# Patient Record
Sex: Female | Born: 1960
Health system: Southern US, Community
[De-identification: ages and names within clinical notes are randomized; demographics above are authoritative.]

## PROBLEM LIST (undated history)

## (undated) DIAGNOSIS — R030 Elevated blood-pressure reading, without diagnosis of hypertension: Principal | ICD-10-CM

## (undated) DIAGNOSIS — E7800 Pure hypercholesterolemia, unspecified: Secondary | ICD-10-CM

## (undated) DIAGNOSIS — IMO0001 Reserved for inherently not codable concepts without codable children: Secondary | ICD-10-CM

## (undated) DIAGNOSIS — J309 Allergic rhinitis, unspecified: Secondary | ICD-10-CM

## (undated) HISTORY — DX: Pure hypercholesterolemia, unspecified: E78.00

## (undated) HISTORY — DX: Elevated blood-pressure reading, without diagnosis of hypertension: R03.0

## (undated) HISTORY — DX: Allergic rhinitis, unspecified: J30.9

## (undated) HISTORY — DX: Reserved for inherently not codable concepts without codable children: IMO0001

---

## 1990-04-25 HISTORY — PX: DILATION AND CURETTAGE, DIAGNOSTIC / THERAPEUTIC: SUR384

## 1990-04-25 HISTORY — PX: OTHER SURGICAL HISTORY: SHX169

## 1995-04-26 HISTORY — PX: HYSTEROSCOPY: SHX211

## 2004-04-13 ENCOUNTER — Ambulatory Visit: Payer: Self-pay | Admitting: Unknown Physician Specialty

## 2004-05-25 ENCOUNTER — Ambulatory Visit: Payer: Self-pay | Admitting: Unknown Physician Specialty

## 2005-06-07 ENCOUNTER — Ambulatory Visit: Payer: Self-pay | Admitting: Unknown Physician Specialty

## 2006-06-09 ENCOUNTER — Ambulatory Visit: Payer: Self-pay | Admitting: Unknown Physician Specialty

## 2007-06-13 ENCOUNTER — Ambulatory Visit: Payer: Self-pay | Admitting: Unknown Physician Specialty

## 2008-06-18 ENCOUNTER — Ambulatory Visit: Payer: Self-pay | Admitting: Unknown Physician Specialty

## 2008-06-24 ENCOUNTER — Ambulatory Visit: Payer: Self-pay | Admitting: Unknown Physician Specialty

## 2008-12-26 ENCOUNTER — Ambulatory Visit: Payer: Self-pay | Admitting: General Surgery

## 2009-07-21 ENCOUNTER — Ambulatory Visit: Payer: Self-pay | Admitting: Unknown Physician Specialty

## 2010-09-08 ENCOUNTER — Ambulatory Visit: Payer: Self-pay | Admitting: Unknown Physician Specialty

## 2011-12-08 ENCOUNTER — Ambulatory Visit: Payer: Self-pay | Admitting: Unknown Physician Specialty

## 2013-02-28 ENCOUNTER — Telehealth: Payer: Self-pay | Admitting: Emergency Medicine

## 2013-02-28 NOTE — Telephone Encounter (Signed)
Patient lvm stating she tried to schedule her own mammo but can't b/c she has been seen in our clinic yet. Pt wanting mammogram before being seen for new pt apt. Please advise.

## 2013-02-28 NOTE — Telephone Encounter (Signed)
I cannot order a mammogram on someone that I have not seen.  She can have her previous provider order or I can order when she is seen here.

## 2013-02-28 NOTE — Telephone Encounter (Signed)
Left detailed voicemail

## 2013-05-28 ENCOUNTER — Ambulatory Visit (INDEPENDENT_AMBULATORY_CARE_PROVIDER_SITE_OTHER): Payer: BC Managed Care – PPO | Admitting: Internal Medicine

## 2013-05-28 ENCOUNTER — Encounter: Payer: Self-pay | Admitting: Internal Medicine

## 2013-05-28 ENCOUNTER — Telehealth: Payer: Self-pay | Admitting: Internal Medicine

## 2013-05-28 VITALS — BP 138/90 | HR 65 | Temp 98.2°F | Ht 64.5 in | Wt 150.0 lb

## 2013-05-28 DIAGNOSIS — E7800 Pure hypercholesterolemia, unspecified: Secondary | ICD-10-CM

## 2013-05-28 DIAGNOSIS — Z1239 Encounter for other screening for malignant neoplasm of breast: Secondary | ICD-10-CM

## 2013-05-28 DIAGNOSIS — IMO0001 Reserved for inherently not codable concepts without codable children: Secondary | ICD-10-CM

## 2013-05-28 DIAGNOSIS — R110 Nausea: Secondary | ICD-10-CM

## 2013-05-28 DIAGNOSIS — R030 Elevated blood-pressure reading, without diagnosis of hypertension: Secondary | ICD-10-CM

## 2013-05-28 NOTE — Telephone Encounter (Signed)
Pt would like her mammogram on Tuesday Wednesday afternoon   Tammy Daniels

## 2013-05-28 NOTE — Progress Notes (Signed)
Pre-visit discussion using our clinic review tool. No additional management support is needed unless otherwise documented below in the visit note.  

## 2013-05-29 NOTE — Telephone Encounter (Signed)
Dr. Lorin PicketScott, can you place a mammo order.

## 2013-05-29 NOTE — Telephone Encounter (Signed)
Order placed for mammogram.

## 2013-05-30 ENCOUNTER — Telehealth: Payer: Self-pay | Admitting: *Deleted

## 2013-05-30 DIAGNOSIS — Z1211 Encounter for screening for malignant neoplasm of colon: Secondary | ICD-10-CM

## 2013-05-30 NOTE — Telephone Encounter (Signed)
Pt called back to let you know that she would like to go to Dr. Lutricia FeilPaul Oh for her Colonoscopy & [place] for her Mammogram (both are covered in her network)

## 2013-05-31 NOTE — Telephone Encounter (Signed)
Order already placed for mammogram.  Order placed for referral to GI for colonoscopy.

## 2013-06-02 ENCOUNTER — Encounter: Payer: Self-pay | Admitting: Internal Medicine

## 2013-06-02 DIAGNOSIS — I10 Essential (primary) hypertension: Secondary | ICD-10-CM | POA: Insufficient documentation

## 2013-06-02 DIAGNOSIS — E7800 Pure hypercholesterolemia, unspecified: Secondary | ICD-10-CM | POA: Insufficient documentation

## 2013-06-02 NOTE — Progress Notes (Signed)
   Subjective:    Patient ID: Tammy Daniels, female    DOB: 09/07/1960, 53 y.o.   MRN: [id number]  HPI 53 year old female with past history of hypercholesterolemia and elevated blood pressure who comes in today to follow up on these issues as well as to transfer her care here to [place].  Was a former patient of mine at [facility].  Has been exercising.  Walking on a treadmill.  States has some occasional nausea.  Nothing on a regular basis.  Rare.  No acid reflux.  Aggravated by eating greasy foods.  No cardiac symptoms with increased activity or exertion.  No bowel change.  Blood pressure on outside checks averaging 112-137/70-80s.     Past Medical History  Diagnosis Date  . Hypercholesterolemia   . Elevated blood pressure   . Allergic rhinitis     Outpatient Encounter Prescriptions as of 05/28/2013  Medication Sig  . CALCIUM PO Take by mouth.    Review of Systems Patient denies any headache, lightheadedness or dizziness.  No sinus or allergy symptoms.  No chest pain, tightness or palpitations.  No increased shortness of breath, cough or congestion.  No vomiting.  Some intermittent nausea.  No acid reflux.  No abdominal pain or cramping.  No bowel change, such as diarrhea, constipation, BRBPR or melana.  No urine change.   Blood pressure as outlined.       Objective:   Physical Exam Filed Vitals:   05/28/13 1547  BP: 138/90  Pulse: 65  Temp: 98.2 F (6736.718 C)   53 year old female in no acute distress.   HEENT:  Nares- clear.  Oropharynx - without lesions. NECK:  Supple.  Nontender.  No audible bruit.  HEART:  Appears to be regular. LUNGS:  No crackles or wheezing audible.  Respirations even and unlabored.  RADIAL PULSE:  Equal bilaterally.   ABDOMEN:  Soft, nontender.  Bowel sounds present and normal.  No audible abdominal bruit.   EXTREMITIES:  No increased edema present.  DP pulses palpable and equal bilaterally.          Assessment & Plan:  HEALTH MAINTENANCE.  Schedule her  for a physical next visit.  Schedule mammogram.  Will notify me if agreeable for colonoscopy.  Wants to check with her insurance.    I spent 25 minutes with the patient and more than 50% of the time was spent in consultation regarding the above.

## 2013-06-02 NOTE — Assessment & Plan Note (Signed)
Intermittent with greasy foods.  Desires no further w/up at this point.  Follow.

## 2013-06-02 NOTE — Assessment & Plan Note (Signed)
Low cholesterol diet and exercise.  Check lipid panel.   

## 2013-06-02 NOTE — Assessment & Plan Note (Signed)
Blood pressure as outlined.  Have her to continue to follow.  Check metabolic panel.

## 2013-06-12 ENCOUNTER — Other Ambulatory Visit: Payer: BC Managed Care – PPO

## 2013-06-19 ENCOUNTER — Other Ambulatory Visit: Payer: BC Managed Care – PPO

## 2013-06-21 ENCOUNTER — Encounter: Payer: Self-pay | Admitting: Internal Medicine

## 2013-06-21 DIAGNOSIS — E559 Vitamin D deficiency, unspecified: Secondary | ICD-10-CM

## 2013-06-26 ENCOUNTER — Other Ambulatory Visit (INDEPENDENT_AMBULATORY_CARE_PROVIDER_SITE_OTHER): Payer: BC Managed Care – PPO

## 2013-06-26 ENCOUNTER — Ambulatory Visit: Payer: Self-pay | Admitting: Internal Medicine

## 2013-06-26 DIAGNOSIS — IMO0001 Reserved for inherently not codable concepts without codable children: Secondary | ICD-10-CM

## 2013-06-26 DIAGNOSIS — E7800 Pure hypercholesterolemia, unspecified: Secondary | ICD-10-CM

## 2013-06-26 DIAGNOSIS — R110 Nausea: Secondary | ICD-10-CM

## 2013-06-26 DIAGNOSIS — R030 Elevated blood-pressure reading, without diagnosis of hypertension: Secondary | ICD-10-CM

## 2013-06-26 LAB — COMPREHENSIVE METABOLIC PANEL
ALT: 29 U/L (ref 0–35)
AST: 25 U/L (ref 0–37)
Albumin: 3.8 g/dL (ref 3.5–5.2)
Alkaline Phosphatase: 86 U/L (ref 39–117)
BUN: 14 mg/dL (ref 6–23)
CO2: 30 mEq/L (ref 19–32)
Calcium: 9.1 mg/dL (ref 8.4–10.5)
Chloride: 107 mEq/L (ref 96–112)
Creatinine, Ser: 0.7 mg/dL (ref 0.4–1.2)
GFR: 94.29 mL/min (ref >60.00)
Glucose, Bld: 93 mg/dL (ref 70–99)
Potassium: 4 mEq/L (ref 3.5–5.1)
Sodium: 142 mEq/L (ref 135–145)
Total Bilirubin: 0.8 mg/dL (ref 0.3–1.2)
Total Protein: 6.9 g/dL (ref 6.0–8.3)

## 2013-06-26 LAB — TSH: TSH: 2.91 u[IU]/mL (ref 0.35–5.50)

## 2013-06-26 LAB — CBC WITH DIFFERENTIAL/PLATELET
Basophils Absolute: 0 10*3/uL (ref 0.0–0.1)
Basophils Relative: 0.6 % (ref 0.0–3.0)
Eosinophils Absolute: 0.4 10*3/uL (ref 0.0–0.7)
Eosinophils Relative: 5.3 % — ABNORMAL HIGH (ref 0.0–5.0)
HCT: 44.3 % (ref 36.0–46.0)
Hemoglobin: 14.5 g/dL (ref 12.0–15.0)
Lymphocytes Relative: 30.6 % (ref 12.0–46.0)
Lymphs Abs: 2.3 10*3/uL (ref 0.7–4.0)
MCHC: 32.8 g/dL (ref 30.0–36.0)
MCV: 89.8 fl (ref 78.0–100.0)
Monocytes Absolute: 0.5 10*3/uL (ref 0.1–1.0)
Monocytes Relative: 6.2 % (ref 3.0–12.0)
Neutro Abs: 4.4 10*3/uL (ref 1.4–7.7)
Neutrophils Relative %: 57.3 % (ref 43.0–77.0)
Platelets: 245 10*3/uL (ref 150.0–400.0)
RBC: 4.93 Mil/uL (ref 3.87–5.11)
RDW: 12.8 % (ref 11.5–14.6)
WBC: 7.7 10*3/uL (ref 4.5–10.5)

## 2013-06-26 LAB — HM MAMMOGRAPHY

## 2013-06-26 LAB — LIPID PANEL
Cholesterol: 211 mg/dL — ABNORMAL HIGH (ref 0–200)
HDL: 47.8 mg/dL (ref >39.00)
LDL Cholesterol: 132 mg/dL — ABNORMAL HIGH (ref 0–99)
Total CHOL/HDL Ratio: 4
Triglycerides: 158 mg/dL — ABNORMAL HIGH (ref 0.0–149.0)
VLDL: 31.6 mg/dL (ref 0.0–40.0)

## 2013-06-27 ENCOUNTER — Encounter: Payer: Self-pay | Admitting: *Deleted

## 2013-06-27 ENCOUNTER — Encounter: Payer: Self-pay | Admitting: Internal Medicine

## 2013-07-09 LAB — HM COLONOSCOPY

## 2013-07-16 ENCOUNTER — Ambulatory Visit: Payer: Self-pay | Admitting: Internal Medicine

## 2013-07-16 ENCOUNTER — Encounter: Payer: Self-pay | Admitting: Internal Medicine

## 2013-07-16 LAB — HM MAMMOGRAPHY: HM Mammogram: NEGATIVE

## 2013-07-19 ENCOUNTER — Encounter: Payer: Self-pay | Admitting: Internal Medicine

## 2013-07-30 ENCOUNTER — Encounter: Payer: Self-pay | Admitting: Internal Medicine

## 2013-07-30 ENCOUNTER — Ambulatory Visit (INDEPENDENT_AMBULATORY_CARE_PROVIDER_SITE_OTHER): Payer: BC Managed Care – PPO | Admitting: Internal Medicine

## 2013-07-30 VITALS — BP 140/90 | HR 61 | Temp 98.2°F | Ht 64.5 in | Wt 149.8 lb

## 2013-07-30 DIAGNOSIS — E7800 Pure hypercholesterolemia, unspecified: Secondary | ICD-10-CM

## 2013-07-30 DIAGNOSIS — R030 Elevated blood-pressure reading, without diagnosis of hypertension: Secondary | ICD-10-CM

## 2013-07-30 DIAGNOSIS — IMO0001 Reserved for inherently not codable concepts without codable children: Secondary | ICD-10-CM

## 2013-07-30 DIAGNOSIS — M6530 Trigger finger, unspecified finger: Secondary | ICD-10-CM

## 2013-07-30 DIAGNOSIS — R110 Nausea: Secondary | ICD-10-CM

## 2013-07-30 DIAGNOSIS — E559 Vitamin D deficiency, unspecified: Secondary | ICD-10-CM

## 2013-07-30 NOTE — Assessment & Plan Note (Signed)
Blood pressure as outlined.  Have her to continue to follow.  Check metabolic panel.

## 2013-07-30 NOTE — Progress Notes (Signed)
   Subjective:    Patient ID: Tammy Daniels, female    DOB: 07/03/1960, 53 y.o.   MRN: [id number]  HPI [age] year old female with past history of hypercholesterolemia and elevated blood pressure who comes in today to follow up on these issues as well as for a complete physical exam.   Has been exercising.  Walking on a treadmill.  Feels good.  No significant nausea.  No vomiting.   No acid reflux.  No cardiac symptoms with increased activity or exertion.  No bowel change.  Blood pressure on outside checks averaging 120-130s/70-80s. She does have a trigger finger - right third finger.  No pain.  Discussed conservative treatment.      Past Medical History  Diagnosis Date  . Hypercholesterolemia   . Elevated blood pressure   . Allergic rhinitis     Outpatient Encounter Prescriptions as of 07/30/2013  Medication Sig  . CALCIUM PO Take by mouth.    Review of Systems Patient denies any headache, lightheadedness or dizziness.  No sinus or allergy symptoms.  No chest pain, tightness or palpitations.  No increased shortness of breath, cough or congestion.  No vomiting.  No significant nausea reported.  No acid reflux.  No abdominal pain or cramping.  No bowel change, such as diarrhea, constipation, BRBPR or melana.  No urine change.   Blood pressure as outlined.  Right trigger finger as outlined.       Objective:   Physical Exam  Filed Vitals:   07/30/13 1136  BP: 140/90  Pulse: 61  Temp: 98.2 F (736.618 C)   [age] year old female in no acute distress.   HEENT:  Nares- clear.  Oropharynx - without lesions. NECK:  Supple.  Nontender.  No audible bruit.  HEART:  Appears to be regular. LUNGS:  No crackles or wheezing audible.  Respirations even and unlabored.  RADIAL PULSE:  Equal bilaterally.    BREASTS:  No nipple discharge or nipple retraction present.  Could not appreciate any distinct nodules or axillary adenopathy.  ABDOMEN:  Soft, nontender.  Bowel sounds present and normal.  No audible  abdominal bruit.  GU:  Not performed.     EXTREMITIES:  No increased edema present.  DP pulses palpable and equal bilaterally.          Assessment & Plan:  HEALTH MAINTENANCE.  Physical today.  10/24/11 - negative pap.  She declined pap today.   States had diagnostic mammogram that was ok.  Per quick abstraction, mammogram 07/16/13 [facility] I.  Colonoscopy 3/15 ok.    I spent 25 minutes with the patient and more than 50% of the time was spent in consultation regarding the above.

## 2013-07-30 NOTE — Progress Notes (Signed)
Pre-visit discussion using our clinic review tool. No additional management support is needed unless otherwise documented below in the visit note.  

## 2013-07-30 NOTE — Assessment & Plan Note (Signed)
Low cholesterol diet and exercise.  Check lipid panel.   

## 2013-08-04 ENCOUNTER — Encounter: Payer: Self-pay | Admitting: Internal Medicine

## 2013-08-04 DIAGNOSIS — M6530 Trigger finger, unspecified finger: Secondary | ICD-10-CM | POA: Insufficient documentation

## 2013-08-04 NOTE — Assessment & Plan Note (Signed)
Resolved

## 2013-08-04 NOTE — Assessment & Plan Note (Signed)
Follow vitamin D level.  

## 2013-08-04 NOTE — Assessment & Plan Note (Signed)
Involves right third finger.  Splint.  Follow.  Desires no further w/up at this time.

## 2013-11-12 ENCOUNTER — Encounter: Payer: Self-pay | Admitting: Internal Medicine

## 2013-11-12 ENCOUNTER — Ambulatory Visit (INDEPENDENT_AMBULATORY_CARE_PROVIDER_SITE_OTHER): Payer: BC Managed Care – PPO | Admitting: Internal Medicine

## 2013-11-12 ENCOUNTER — Other Ambulatory Visit (HOSPITAL_COMMUNITY)
Admission: RE | Admit: 2013-11-12 | Discharge: 2013-11-12 | Disposition: A | Discharge status: Home / Self Care | Payer: BC Managed Care – PPO | Source: Ambulatory Visit | Attending: Internal Medicine | Admitting: Internal Medicine

## 2013-11-12 VITALS — BP 126/92 | HR 72 | Temp 98.3°F | Ht 64.5 in | Wt 151.5 lb

## 2013-11-12 DIAGNOSIS — IMO0001 Reserved for inherently not codable concepts without codable children: Secondary | ICD-10-CM

## 2013-11-12 DIAGNOSIS — Z1151 Encounter for screening for human papillomavirus (HPV): Secondary | ICD-10-CM | POA: Insufficient documentation

## 2013-11-12 DIAGNOSIS — R030 Elevated blood-pressure reading, without diagnosis of hypertension: Secondary | ICD-10-CM

## 2013-11-12 DIAGNOSIS — Z124 Encounter for screening for malignant neoplasm of cervix: Secondary | ICD-10-CM

## 2013-11-12 DIAGNOSIS — E7800 Pure hypercholesterolemia, unspecified: Secondary | ICD-10-CM

## 2013-11-12 DIAGNOSIS — Z01419 Encounter for gynecological examination (general) (routine) without abnormal findings: Secondary | ICD-10-CM | POA: Insufficient documentation

## 2013-11-12 MED ORDER — SERTRALINE HCL 50 MG PO TABS: 50.0000 mg | ORAL_TABLET | Freq: Every day | ORAL | Status: DC

## 2013-11-12 NOTE — Patient Instructions (Signed)
Take 1/2 tablet per day for one week and then increase to one whole tablet per day. 

## 2013-11-12 NOTE — Progress Notes (Signed)
Pre visit review using our clinic review tool, if applicable. No additional management support is needed unless otherwise documented below in the visit note. 

## 2013-11-14 LAB — CYTOLOGY - PAP

## 2013-11-15 ENCOUNTER — Encounter: Payer: Self-pay | Admitting: *Deleted

## 2013-11-17 ENCOUNTER — Encounter: Payer: Self-pay | Admitting: Internal Medicine

## 2013-11-17 NOTE — Progress Notes (Signed)
   Subjective:    Patient ID: Tammy Daniels, female    DOB: 02/23/1961, 53 y.o.   MRN: [id number]  Gynecologic Exam  53 year old female with past history of hypercholesterolemia and elevated blood pressure who comes in today to follow up on these issues as well as for a pap smear.   Has been exercising.  Walking on a treadmill.  Feels good.  No significant nausea.  No vomiting.   No acid reflux.  No cardiac symptoms with increased activity or exertion.  No bowel change.  Blood pressure on outside checks averaging 120-130s/70-80s.  See her attached list for details.     Past Medical History  Diagnosis Date  . Hypercholesterolemia   . Elevated blood pressure   . Allergic rhinitis     Outpatient Encounter Prescriptions as of 11/12/2013  Medication Sig  . CALCIUM PO Take by mouth.  . sertraline (ZOLOFT) 50 MG tablet Take 1 tablet (50 mg total) by mouth daily.    Review of Systems Patient denies any headache, lightheadedness or dizziness.  No sinus or allergy symptoms.  No chest pain, tightness or palpitations.  No increased shortness of breath, cough or congestion.  No vomiting.  No nausea reported.  No acid reflux.  No abdominal pain or cramping.  No bowel change, such as diarrhea, constipation, BRBPR or melana.  No urine change.   Blood pressure as outlined.      Objective:   Physical Exam  Filed Vitals:   11/12/13 0949  BP: 126/92  Pulse: 72  Temp: 98.3 F (36.8 C)   Blood pressure recheck:  89128/584  53 year old female in no acute distress.   HEENT:  Nares- clear.  Oropharynx - without lesions. NECK:  Supple.  Nontender.  No audible bruit.  HEART:  Appears to be regular. LUNGS:  No crackles or wheezing audible.  Respirations even and unlabored.  RADIAL PULSE:  Equal bilaterally.  ABDOMEN:  Soft, nontender.  Bowel sounds present and normal.  No audible abdominal bruit.  GU:  Normal external genitalia.  Vaginal vault without lesions.  Cervix identified.  Pap performed. Could not  appreciate any adnexal masses or tenderness.   EXTREMITIES:  No increased edema present.  DP pulses palpable and equal bilaterally.          Assessment & Plan:  HEALTH MAINTENANCE.  Physical 07/30/13.   10/24/11 - negative pap.  She declined pap at physical so pap performed today.  States had diagnostic mammogram that was ok.  Per quick abstraction, mammogram 07/16/13 [facility] I.  Colonoscopy 3/15 ok.

## 2013-11-17 NOTE — Assessment & Plan Note (Signed)
Blood pressure as outlined.  Have her to continue to follow.  Follow metabolic panel.

## 2013-11-17 NOTE — Assessment & Plan Note (Signed)
Low cholesterol diet and exercise.  Follow lipid panel.   

## 2013-12-17 ENCOUNTER — Other Ambulatory Visit: Payer: BC Managed Care – PPO

## 2013-12-24 ENCOUNTER — Other Ambulatory Visit (INDEPENDENT_AMBULATORY_CARE_PROVIDER_SITE_OTHER): Payer: BC Managed Care – PPO

## 2013-12-24 DIAGNOSIS — IMO0001 Reserved for inherently not codable concepts without codable children: Secondary | ICD-10-CM

## 2013-12-24 DIAGNOSIS — R030 Elevated blood-pressure reading, without diagnosis of hypertension: Secondary | ICD-10-CM

## 2013-12-24 DIAGNOSIS — E7800 Pure hypercholesterolemia, unspecified: Secondary | ICD-10-CM

## 2013-12-24 LAB — LIPID PANEL
Cholesterol: 217 mg/dL — ABNORMAL HIGH (ref 0–200)
HDL: 50.4 mg/dL (ref >39.00)
LDL Cholesterol: 139 mg/dL — ABNORMAL HIGH (ref 0–99)
NonHDL: 166.6
Total CHOL/HDL Ratio: 4
Triglycerides: 137 mg/dL (ref 0.0–149.0)
VLDL: 27.4 mg/dL (ref 0.0–40.0)

## 2013-12-24 LAB — COMPREHENSIVE METABOLIC PANEL
ALT: 17 U/L (ref 0–35)
AST: 18 U/L (ref 0–37)
Albumin: 3.9 g/dL (ref 3.5–5.2)
Alkaline Phosphatase: 87 U/L (ref 39–117)
BUN: 16 mg/dL (ref 6–23)
CO2: 28 mEq/L (ref 19–32)
Calcium: 8.9 mg/dL (ref 8.4–10.5)
Chloride: 106 mEq/L (ref 96–112)
Creatinine, Ser: 0.7 mg/dL (ref 0.4–1.2)
GFR: 92.57 mL/min (ref >60.00)
Glucose, Bld: 98 mg/dL (ref 70–99)
Potassium: 3.8 mEq/L (ref 3.5–5.1)
Sodium: 141 mEq/L (ref 135–145)
Total Bilirubin: 0.8 mg/dL (ref 0.2–1.2)
Total Protein: 6.9 g/dL (ref 6.0–8.3)

## 2013-12-25 ENCOUNTER — Encounter: Payer: Self-pay | Admitting: *Deleted

## 2014-01-07 ENCOUNTER — Ambulatory Visit (INDEPENDENT_AMBULATORY_CARE_PROVIDER_SITE_OTHER): Payer: BC Managed Care – PPO | Admitting: Internal Medicine

## 2014-01-07 ENCOUNTER — Encounter: Payer: Self-pay | Admitting: Internal Medicine

## 2014-01-07 VITALS — BP 120/82 | HR 62 | Temp 97.8°F | Resp 14 | Ht 64.5 in | Wt 148.5 lb

## 2014-01-07 DIAGNOSIS — R030 Elevated blood-pressure reading, without diagnosis of hypertension: Secondary | ICD-10-CM

## 2014-01-07 DIAGNOSIS — Z733 Stress, not elsewhere classified: Secondary | ICD-10-CM

## 2014-01-07 DIAGNOSIS — F439 Reaction to severe stress, unspecified: Secondary | ICD-10-CM

## 2014-01-07 DIAGNOSIS — E7800 Pure hypercholesterolemia, unspecified: Secondary | ICD-10-CM

## 2014-01-07 DIAGNOSIS — E559 Vitamin D deficiency, unspecified: Secondary | ICD-10-CM

## 2014-01-07 DIAGNOSIS — IMO0001 Reserved for inherently not codable concepts without codable children: Secondary | ICD-10-CM

## 2014-01-07 NOTE — Progress Notes (Signed)
Pre visit review using our clinic review tool, if applicable. No additional management support is needed unless otherwise documented below in the visit note. 

## 2014-01-12 ENCOUNTER — Encounter: Payer: Self-pay | Admitting: Internal Medicine

## 2014-01-12 DIAGNOSIS — F439 Reaction to severe stress, unspecified: Secondary | ICD-10-CM | POA: Insufficient documentation

## 2014-01-12 NOTE — Assessment & Plan Note (Signed)
Follow vitamin D level.  

## 2014-01-12 NOTE — Assessment & Plan Note (Signed)
Increased stress.  On zoloft.  Doing better.

## 2014-01-12 NOTE — Assessment & Plan Note (Addendum)
Low cholesterol diet and exercise.  Follow lipid panel.  Cholesterol panel just checked revealed total cholesterol 217, triglycerides 137, HDL 50 and LDL 139.

## 2014-01-12 NOTE — Progress Notes (Signed)
   Subjective:    Patient ID: Tammy Daniels, female    DOB: December 25, 1960, 53 y.o.   MRN: [id number]  HPI 54 year old female with past history of hypercholesterolemia and elevated blood pressure who comes in today for a scheduled follow up.  Here to f/u on her blood pressures.   Has been exercising.  No vomiting.   No acid reflux.  No cardiac symptoms with increased activity or exertion.  No bowel change.  Blood pressure on outside checks averaging 120-130s/70-80s.  On zoloft.  This is working well for her.       Past Medical History  Diagnosis Date  . Hypercholesterolemia   . Elevated blood pressure   . Allergic rhinitis     Outpatient Encounter Prescriptions as of 01/07/2014  Medication Sig  . CALCIUM PO Take by mouth.  . sertraline (ZOLOFT) 50 MG tablet Take 1 tablet (50 mg total) by mouth daily.    Review of Systems Patient denies any headache, lightheadedness or dizziness.  No sinus or allergy symptoms.  No chest pain, tightness or palpitations.  No increased shortness of breath, cough or congestion.  No vomiting.  No acid reflux.  No abdominal pain or cramping.  No bowel change, such as diarrhea, constipation, BRBPR or melana.  No urine change.   Blood pressure as outlined.       Objective:   Physical Exam  Filed Vitals:   01/07/14 1159  BP: 120/82  Pulse: 62  Temp: 97.8 F (36.6 C)  Resp: 14   Blood pressure recheck:  31/54  53 year old female in no acute distress.   HEENT:  Nares- clear.  Oropharynx - without lesions. NECK:  Supple.  Nontender.  No audible bruit.  HEART:  Appears to be regular. LUNGS:  No crackles or wheezing audible.  Respirations even and unlabored.  RADIAL PULSE:  Equal bilaterally.  ABDOMEN:  Soft, nontender.  Bowel sounds present and normal.  No audible abdominal bruit.     EXTREMITIES:  No increased edema present.  DP pulses palpable and equal bilaterally.          Assessment & Plan:  HEALTH MAINTENANCE.  Physical 07/30/13.  10/24/11 - negative  pap.  She declined pap at her last physical.   States had diagnostic mammogram that was ok.  Per quick abstraction, mammogram 07/16/13 [facility] I.  Colonoscopy 3/15 ok.

## 2014-01-12 NOTE — Assessment & Plan Note (Signed)
Blood pressure appears to be doing well.  Follow.    

## 2014-01-28 ENCOUNTER — Telehealth: Payer: Self-pay | Admitting: *Deleted

## 2014-01-28 ENCOUNTER — Ambulatory Visit (INDEPENDENT_AMBULATORY_CARE_PROVIDER_SITE_OTHER): Payer: BC Managed Care – PPO | Admitting: Internal Medicine

## 2014-01-28 ENCOUNTER — Encounter: Payer: Self-pay | Admitting: Internal Medicine

## 2014-01-28 VITALS — BP 124/90 | HR 63 | Temp 97.9°F | Ht 64.5 in | Wt 145.0 lb

## 2014-01-28 DIAGNOSIS — IMO0001 Reserved for inherently not codable concepts without codable children: Secondary | ICD-10-CM

## 2014-01-28 DIAGNOSIS — R030 Elevated blood-pressure reading, without diagnosis of hypertension: Secondary | ICD-10-CM

## 2014-01-28 DIAGNOSIS — R197 Diarrhea, unspecified: Secondary | ICD-10-CM

## 2014-01-28 NOTE — Telephone Encounter (Signed)
Since persistent diarrhea, needs evaluation.  See if pt can come in at 11:00 today.

## 2014-01-28 NOTE — Telephone Encounter (Signed)
Pt reports diarrhea daily since starting to take Zoloft daily. Pt stated that she hasn't taken any since Saturday, still experiencing diarrhea. No stomach pain, fever, vomiting, bloating, or nausea. Please advise. (Okay to leave voicemail on cell)

## 2014-01-28 NOTE — Telephone Encounter (Signed)
Pt notified, verbalized understanding.

## 2014-01-28 NOTE — Patient Instructions (Signed)
Align - one per day 

## 2014-01-28 NOTE — Progress Notes (Signed)
Pre visit review using our clinic review tool, if applicable. No additional management support is needed unless otherwise documented below in the visit note. 

## 2014-01-30 LAB — FECAL LACTOFERRIN, QUANT: Lactoferrin: NEGATIVE

## 2014-01-30 LAB — C. DIFFICILE GDH AND TOXIN A/B
C. difficile GDH: NOT DETECTED
C. difficile Toxin A/B: NOT DETECTED

## 2014-01-30 LAB — OVA AND PARASITE EXAMINATION: OP: NONE SEEN

## 2014-02-02 ENCOUNTER — Encounter: Payer: Self-pay | Admitting: Internal Medicine

## 2014-02-02 DIAGNOSIS — R195 Other fecal abnormalities: Secondary | ICD-10-CM | POA: Insufficient documentation

## 2014-02-02 LAB — STOOL CULTURE

## 2014-02-02 NOTE — Progress Notes (Signed)
   Subjective:    Patient ID: Tammy Daniels, female    DOB: 02/05/1961, 53 y.o.   MRN: [id number]  Diarrhea   53 year old female with past history of hypercholesterolemia and elevated blood pressure who comes in today as a work in with concerns regarding persistent diarrhea.  Has been present for two weeks.  No nausea.  No vomiting.   No acid reflux.  Appetite good.  States she is having watery stool.  No abdominal pain.  1-2 stools per day.  No blood.  She was questioning if zoloft contributing.  Stopped her zoloft several days ago.         Past Medical History  Diagnosis Date  . Hypercholesterolemia   . Elevated blood pressure   . Allergic rhinitis     Outpatient Encounter Prescriptions as of 01/28/2014  Medication Sig  . CALCIUM PO Take by mouth.  . sertraline (ZOLOFT) 50 MG tablet Take 1 tablet (50 mg total) by mouth daily.    Review of Systems  Gastrointestinal: Positive for diarrhea.  Patient denies any headache, lightheadedness or dizziness.  No fever.   No vomiting.  No acid reflux.  No abdominal pain or cramping.  Diarrhea as outlined.  No blood.  Watery stool.  Present for two weeks.       Objective:   Physical Exam  Filed Vitals:   01/28/14 1058  BP: 124/90  Pulse: 63  Temp: 97.9 F (336.666 C)   53 year old female in no acute distress.   HEENT:  Nares- clear.  Oropharynx - without lesions. NECK:  Supple.  Nontender.  HEART:  Appears to be regular. LUNGS:  No crackles or wheezing audible.  Respirations even and unlabored.  ABDOMEN:  Soft, nontender.  Bowel sounds present and normal.  No audible abdominal bruit.          Assessment & Plan:  HEALTH MAINTENANCE.  Physical 07/30/13.  10/24/11 - negative pap.  She declined pap at her last physical.   States had diagnostic mammogram that was ok.  Per quick abstraction, mammogram 07/16/13 [facility] I.  Colonoscopy 3/15 ok.

## 2014-02-02 NOTE — Assessment & Plan Note (Signed)
Blood pressure elevated today.  Have her spot check her pressure.  Follow.

## 2014-02-02 NOTE — Assessment & Plan Note (Signed)
Persistent diarrhea.  No pain.  Questioning if medication contributing.  Is off zoloft.  Will remain off.  Check stool studies, including c. Diff, wbc's, routine culture and O&P.  Start probiotic daily.  Follow.

## 2014-02-12 ENCOUNTER — Ambulatory Visit (INDEPENDENT_AMBULATORY_CARE_PROVIDER_SITE_OTHER): Payer: BC Managed Care – PPO | Admitting: Internal Medicine

## 2014-02-12 ENCOUNTER — Encounter: Payer: Self-pay | Admitting: Internal Medicine

## 2014-02-12 VITALS — BP 110/80 | HR 66 | Temp 98.2°F | Ht 64.5 in | Wt 145.5 lb

## 2014-02-12 DIAGNOSIS — E7800 Pure hypercholesterolemia, unspecified: Secondary | ICD-10-CM

## 2014-02-12 DIAGNOSIS — F439 Reaction to severe stress, unspecified: Secondary | ICD-10-CM

## 2014-02-12 DIAGNOSIS — R197 Diarrhea, unspecified: Secondary | ICD-10-CM

## 2014-02-12 DIAGNOSIS — R030 Elevated blood-pressure reading, without diagnosis of hypertension: Secondary | ICD-10-CM

## 2014-02-12 DIAGNOSIS — Z658 Other specified problems related to psychosocial circumstances: Secondary | ICD-10-CM

## 2014-02-12 DIAGNOSIS — IMO0001 Reserved for inherently not codable concepts without codable children: Secondary | ICD-10-CM

## 2014-02-12 MED ORDER — CITALOPRAM HYDROBROMIDE 10 MG PO TABS: 10.0000 mg | ORAL_TABLET | Freq: Every day | ORAL | Status: DC

## 2014-02-12 NOTE — Progress Notes (Signed)
Pre visit review using our clinic review tool, if applicable. No additional management support is needed unless otherwise documented below in the visit note. 

## 2014-02-16 ENCOUNTER — Encounter: Payer: Self-pay | Admitting: Internal Medicine

## 2014-02-16 NOTE — Assessment & Plan Note (Signed)
Resolved

## 2014-02-16 NOTE — Progress Notes (Signed)
   Subjective:    Patient ID: Tammy Daniels, female    DOB: 03/22/1961, 53 y.o.   MRN: [id number]  HPI 53 year old female with past history of hypercholesterolemia and elevated blood pressure who comes in today for a scheduled follow up.  Was having issues with diarrhea.  She was concerned about the zoloft contributing.  Stopped zoloft.  Diarrhea has resolved.  Stool studies negative.   No vomiting.   No acid reflux.  No cardiac symptoms with increased activity or exertion.  No bowel change.        Past Medical History  Diagnosis Date  . Hypercholesterolemia   . Elevated blood pressure   . Allergic rhinitis     Outpatient Encounter Prescriptions as of 02/12/2014  Medication Sig  . CALCIUM PO Take by mouth.  . Probiotic Product (PROBIOTIC DAILY PO) Take by mouth.  . citalopram (CELEXA) 10 MG tablet Take 1 tablet (10 mg total) by mouth daily.  . [DISCONTINUED] sertraline (ZOLOFT) 50 MG tablet Take 1 tablet (50 mg total) by mouth daily.    Review of Systems Patient denies any headache, lightheadedness or dizziness.  No sinus or allergy symptoms.  No chest pain, tightness or palpitations.  No increased shortness of breath, cough or congestion.  No vomiting.  No acid reflux.  No abdominal pain or cramping.  No bowel change, such as diarrhea, constipation, BRBPR or melana.  No urine change.   Blood pressure as outlined.  Off zolft.  Feels more irritated.  Sleeping ok.       Objective:   Physical Exam  Filed Vitals:   02/12/14 1146  BP: 110/80  Pulse: 66  Temp: 98.2 F (1036.528 C)   53 year old female in no acute distress.   HEENT:  Nares- clear.  Oropharynx - without lesions. NECK:  Supple.  Nontender.  No audible bruit.  HEART:  Appears to be regular. LUNGS:  No crackles or wheezing audible.  Respirations even and unlabored.  RADIAL PULSE:  Equal bilaterally.  ABDOMEN:  Soft, nontender.  Bowel sounds present and normal.  No audible abdominal bruit.     EXTREMITIES:  No increased  edema present.  DP pulses palpable and equal bilaterally.          Assessment & Plan:  HEALTH MAINTENANCE.  Physical 07/30/13.  10/24/11 - negative pap.  She declined pap at her last physical.   States had diagnostic mammogram that was ok.  Per quick abstraction, mammogram 07/16/13 [facility] I.  Colonoscopy 3/15 ok.    Problem List Items Addressed This Visit   Diarrhea     Resolved.       Elevated blood pressure - Primary     Blood pressure doing well.  Follow.       Hypercholesterolemia     Low cholesterol diet and exercise.  Follow lipid panel.       Stress     Off zoloft.  Feels she needs to be on medication.  Start citalopram 10mg  q day.  Follow.

## 2014-02-16 NOTE — Assessment & Plan Note (Signed)
Blood pressure doing well.  Follow.  

## 2014-02-16 NOTE — Assessment & Plan Note (Signed)
Off zoloft.  Feels she needs to be on medication.  Start citalopram 10mg  q day.  Follow.

## 2014-02-16 NOTE — Assessment & Plan Note (Signed)
Low cholesterol diet and exercise.  Follow lipid panel.   

## 2014-04-09 ENCOUNTER — Encounter: Payer: Self-pay | Admitting: Internal Medicine

## 2014-04-09 ENCOUNTER — Ambulatory Visit (INDEPENDENT_AMBULATORY_CARE_PROVIDER_SITE_OTHER): Payer: BC Managed Care – PPO | Admitting: Internal Medicine

## 2014-04-09 VITALS — BP 110/80 | HR 69 | Temp 98.3°F | Ht 64.5 in | Wt 146.0 lb

## 2014-04-09 DIAGNOSIS — F439 Reaction to severe stress, unspecified: Secondary | ICD-10-CM

## 2014-04-09 DIAGNOSIS — Z658 Other specified problems related to psychosocial circumstances: Secondary | ICD-10-CM

## 2014-04-09 DIAGNOSIS — IMO0001 Reserved for inherently not codable concepts without codable children: Secondary | ICD-10-CM

## 2014-04-09 DIAGNOSIS — E7800 Pure hypercholesterolemia, unspecified: Secondary | ICD-10-CM

## 2014-04-09 DIAGNOSIS — R030 Elevated blood-pressure reading, without diagnosis of hypertension: Secondary | ICD-10-CM

## 2014-04-09 MED ORDER — CITALOPRAM HYDROBROMIDE 10 MG PO TABS: 10.0000 mg | ORAL_TABLET | Freq: Every day | ORAL | Status: DC

## 2014-04-09 NOTE — Progress Notes (Signed)
   Subjective:    Patient ID: Tammy Daniels, female    DOB: 01/23/1961, 53 y.o.   MRN: [id number]  HPI 53 year old female with past history of hypercholesterolemia and elevated blood pressure who comes in today for a scheduled follow up.  Was having issues with diarrhea.  She was concerned about the zoloft contributing.  Stopped zoloft.  Diarrhea has resolved.  Stool studies negative.   No vomiting.   No acid reflux. No further diarrhea.  We started her on citalopram.  Doing well with this.  Feels better.  Feels is helping.  No cardiac symptoms with increased activity or exertion.  No bowel change.        Past Medical History  Diagnosis Date  . Hypercholesterolemia   . Elevated blood pressure   . Allergic rhinitis     Outpatient Encounter Prescriptions as of 04/09/2014  Medication Sig  . CALCIUM PO Take by mouth.  . citalopram (CELEXA) 10 MG tablet Take 1 tablet (10 mg total) by mouth daily.  . [DISCONTINUED] Probiotic Product (PROBIOTIC DAILY PO) Take by mouth.    Review of Systems Patient denies any headache, lightheadedness or dizziness.  No sinus or allergy symptoms.  No chest pain, tightness or palpitations.  No increased shortness of breath, cough or congestion.  No vomiting.  No acid reflux.  No abdominal pain or cramping.  No bowel change, such as diarrhea, constipation, BRBPR or melana.  No urine change.   Blood pressure doing well.  Outside checks averagint 115-130/70-80s.  On citalopram.  Doing well with this.       Objective:   Physical Exam  Filed Vitals:   04/09/14 1011  BP: 110/80  Pulse: 69  Temp: 98.3 F (36.8 C)  .  53 year old female in no acute distress.   HEENT:  Nares- clear.  Oropharynx - without lesions. NECK:  Supple.  Nontender.  No audible bruit.  HEART:  Appears to be regular. LUNGS:  No crackles or wheezing audible.  Respirations even and unlabored.  RADIAL PULSE:  Equal bilaterally.  ABDOMEN:  Soft, nontender.  Bowel sounds present and normal.  No  audible abdominal bruit.     EXTREMITIES:  No increased edema present.  DP pulses palpable and equal bilaterally.          Assessment & Plan:  1. Elevated blood pressure Blood pressure doing better now.  Follow.   2. Hypercholesterolemia Low cholesterol diet and exercise.  Follow.    3. Stress Doing better on citalopram.  Same dose.  Follow.    HEALTH MAINTENANCE.  Physical 07/30/13.  10/24/11 - negative pap.  She declined pap at her last physical.   States had diagnostic mammogram that was ok.  Per quick abstraction, mammogram 07/16/13 [facility] I.  Colonoscopy 3/15 ok.

## 2014-04-09 NOTE — Progress Notes (Signed)
Pre visit review using our clinic review tool, if applicable. No additional management support is needed unless otherwise documented below in the visit note. 

## 2014-04-13 ENCOUNTER — Encounter: Payer: Self-pay | Admitting: Internal Medicine

## 2014-08-19 ENCOUNTER — Encounter: Payer: Self-pay | Admitting: Internal Medicine

## 2014-08-19 ENCOUNTER — Ambulatory Visit (INDEPENDENT_AMBULATORY_CARE_PROVIDER_SITE_OTHER): Payer: Managed Care, Other (non HMO) | Admitting: Internal Medicine

## 2014-08-19 VITALS — BP 129/83 | HR 67 | Temp 97.8°F | Ht 64.5 in | Wt 148.0 lb

## 2014-08-19 DIAGNOSIS — R197 Diarrhea, unspecified: Secondary | ICD-10-CM

## 2014-08-19 DIAGNOSIS — Z658 Other specified problems related to psychosocial circumstances: Secondary | ICD-10-CM

## 2014-08-19 DIAGNOSIS — E7800 Pure hypercholesterolemia, unspecified: Secondary | ICD-10-CM

## 2014-08-19 DIAGNOSIS — Z0000 Encounter for general adult medical examination without abnormal findings: Secondary | ICD-10-CM

## 2014-08-19 DIAGNOSIS — R030 Elevated blood-pressure reading, without diagnosis of hypertension: Secondary | ICD-10-CM | POA: Diagnosis not present

## 2014-08-19 DIAGNOSIS — F439 Reaction to severe stress, unspecified: Secondary | ICD-10-CM

## 2014-08-19 DIAGNOSIS — Z1239 Encounter for other screening for malignant neoplasm of breast: Secondary | ICD-10-CM | POA: Diagnosis not present

## 2014-08-19 DIAGNOSIS — IMO0001 Reserved for inherently not codable concepts without codable children: Secondary | ICD-10-CM

## 2014-08-19 NOTE — Progress Notes (Signed)
Patient ID: Tammy Daniels, female   DOB: 05/22/1960, 54 y.o.   MRN: [id number]   Subjective:    Patient ID: Tammy Daniels, female    DOB: 03/19/1961, 54 y.o.   MRN: [id number]  HPI  Patient here for a scheduled follow up.  Here to f/u on her blood pressure.  Brings in outside readings.  Blood pressures averaging 120-140/60-80s.  Stays active.  No cardiac symptoms with increased activity or exertion.  Breathing stable.  Bowels stable.  No diarrhea.  On citalopram.  Doing well on this.  Feels handling stress well.     Past Medical History  Diagnosis Date  . Hypercholesterolemia   . Elevated blood pressure   . Allergic rhinitis     Outpatient Encounter Prescriptions as of 08/19/2014  Medication Sig  . CALCIUM PO Take by mouth.  . citalopram (CELEXA) 10 MG tablet Take 1 tablet (10 mg total) by mouth daily.    Review of Systems  Constitutional: Negative for appetite change and unexpected weight change.  HENT: Negative for congestion and sinus pressure.   Respiratory: Negative for cough, chest tightness and shortness of breath.   Cardiovascular: Negative for chest pain, palpitations and leg swelling.  Gastrointestinal: Negative for nausea, vomiting, abdominal pain and diarrhea.  Neurological: Negative for dizziness, light-headedness and headaches.  Psychiatric/Behavioral:       Doing well on citalopram.  Handling stress well.         Objective:    Physical Exam  Constitutional: She appears well-developed and well-nourished. No distress.  HENT:  Nose: Nose normal.  Mouth/Throat: Oropharynx is clear and moist.  Neck: Neck supple. No thyromegaly present.  Cardiovascular: Normal rate and regular rhythm.   Pulmonary/Chest: Breath sounds normal. No respiratory distress. She has no wheezes.  Abdominal: Soft. Bowel sounds are normal. There is no tenderness.  Musculoskeletal: She exhibits no edema or tenderness.  Lymphadenopathy:    She has no cervical adenopathy.  Skin: No rash noted.  No erythema.  Psychiatric: She has a normal mood and affect. Her behavior is normal.    BP 129/83 mmHg  Pulse 67  Temp(Src) 97.8 F (36.6 C) (Oral)  Ht 5' 4.5" (1.638 m)  Wt 148 lb (67.132 kg)  BMI 25.02 kg/m2  SpO2 96% Wt Readings from Last 3 Encounters:  08/19/14 148 lb (67.132 kg)  04/09/14 146 lb (66.225 kg)  02/12/14 145 lb 8 oz (65.998 kg)     Lab Results  Component Value Date   WBC 7.7 06/26/2013   HGB 14.5 06/26/2013   HCT 44.3 06/26/2013   PLT 245.0 06/26/2013   GLUCOSE 98 12/24/2013   CHOL 217* 12/24/2013   TRIG 137.0 12/24/2013   HDL 50.40 12/24/2013   LDLCALC 139* 12/24/2013   ALT 17 12/24/2013   AST 18 12/24/2013   NA 141 12/24/2013   K 3.8 12/24/2013   CL 106 12/24/2013   CREATININE 0.7 12/24/2013   BUN 16 12/24/2013   CO2 28 12/24/2013   TSH 2.91 06/26/2013       Assessment & Plan:   Problem List Items Addressed This Visit    Diarrhea    Resolved.  Not an issue now.        Elevated blood pressure    Blood pressure as outlined.  On no medication.  Follow pressures and send in readings.       Relevant Orders   Comprehensive metabolic panel   Health care maintenance    Physical 07/30/13.  PAP 10/24/11 -  negative.  Mammogram 06/26/13 - recommended f/u views.  F/u mammogram 07/16/13 - Birads I.  Colonoscopy 07/09/13 - normal.        Hypercholesterolemia    Low cholesterol diet and exercise.  Follow lipid panel.       Relevant Orders   Lipid panel   Stress    On citalopram.  Doing well.        Relevant Orders   CBC with Differential/Platelet   TSH    Other Visit Diagnoses    Breast cancer screening    -  Primary    Relevant Orders    MM DIGITAL SCREENING BILATERAL        Dale Albia, MD

## 2014-08-19 NOTE — Progress Notes (Signed)
Pre visit review using our clinic review tool, if applicable. No additional management support is needed unless otherwise documented below in the visit note. 

## 2014-08-21 ENCOUNTER — Encounter: Payer: Self-pay | Admitting: Internal Medicine

## 2014-08-21 DIAGNOSIS — Z0000 Encounter for general adult medical examination without abnormal findings: Secondary | ICD-10-CM | POA: Insufficient documentation

## 2014-08-21 NOTE — Assessment & Plan Note (Signed)
On citalopram.  Doing well.  

## 2014-08-21 NOTE — Assessment & Plan Note (Signed)
Low cholesterol diet and exercise.  Follow lipid panel.   

## 2014-08-21 NOTE — Assessment & Plan Note (Signed)
Resolved.  Not an issue now.   

## 2014-08-21 NOTE — Assessment & Plan Note (Signed)
Blood pressure as outlined.  On no medication.  Follow pressures and send in readings.

## 2014-08-21 NOTE — Assessment & Plan Note (Signed)
Physical 07/30/13.  PAP 10/24/11 - negative.  Mammogram 06/26/13 - recommended f/u views.  F/u mammogram 07/16/13 - Birads I.  Colonoscopy 07/09/13 - normal.

## 2014-10-12 ENCOUNTER — Other Ambulatory Visit: Payer: Self-pay | Admitting: Internal Medicine

## 2014-12-01 ENCOUNTER — Telehealth: Payer: Self-pay | Admitting: Internal Medicine

## 2014-12-01 NOTE — Telephone Encounter (Signed)
Spoke with pt, she will keep appoint

## 2014-12-01 NOTE — Telephone Encounter (Signed)
Pt has a lab appt on 12/02/2014 and wanted to come in at 9 due to meeting or go to [facility]. I check the sch and the appt slots was later in the day. Have a great day!

## 2014-12-02 ENCOUNTER — Other Ambulatory Visit (INDEPENDENT_AMBULATORY_CARE_PROVIDER_SITE_OTHER): Payer: Managed Care, Other (non HMO)

## 2014-12-02 ENCOUNTER — Ambulatory Visit
Admission: RE | Admit: 2014-12-02 | Discharge: 2014-12-02 | Disposition: A | Discharge status: Home / Self Care | Payer: Managed Care, Other (non HMO) | Source: Ambulatory Visit | Attending: Internal Medicine | Admitting: Internal Medicine

## 2014-12-02 DIAGNOSIS — Z1231 Encounter for screening mammogram for malignant neoplasm of breast: Secondary | ICD-10-CM | POA: Insufficient documentation

## 2014-12-02 DIAGNOSIS — F439 Reaction to severe stress, unspecified: Secondary | ICD-10-CM

## 2014-12-02 DIAGNOSIS — IMO0001 Reserved for inherently not codable concepts without codable children: Secondary | ICD-10-CM

## 2014-12-02 DIAGNOSIS — Z658 Other specified problems related to psychosocial circumstances: Secondary | ICD-10-CM

## 2014-12-02 DIAGNOSIS — E7800 Pure hypercholesterolemia, unspecified: Secondary | ICD-10-CM

## 2014-12-02 DIAGNOSIS — Z1239 Encounter for other screening for malignant neoplasm of breast: Secondary | ICD-10-CM

## 2014-12-02 DIAGNOSIS — R030 Elevated blood-pressure reading, without diagnosis of hypertension: Secondary | ICD-10-CM

## 2014-12-02 LAB — COMPREHENSIVE METABOLIC PANEL
ALT: 17 U/L (ref 0–35)
AST: 17 U/L (ref 0–37)
Albumin: 4.3 g/dL (ref 3.5–5.2)
Alkaline Phosphatase: 93 U/L (ref 39–117)
BUN: 16 mg/dL (ref 6–23)
CO2: 29 mEq/L (ref 19–32)
Calcium: 9.3 mg/dL (ref 8.4–10.5)
Chloride: 106 mEq/L (ref 96–112)
Creatinine, Ser: 0.62 mg/dL (ref 0.40–1.20)
GFR: 106.12 mL/min (ref >60.00)
Glucose, Bld: 88 mg/dL (ref 70–99)
Potassium: 4.2 mEq/L (ref 3.5–5.1)
Sodium: 142 mEq/L (ref 135–145)
Total Bilirubin: 0.5 mg/dL (ref 0.2–1.2)
Total Protein: 7 g/dL (ref 6.0–8.3)

## 2014-12-02 LAB — CBC WITH DIFFERENTIAL/PLATELET
Basophils Absolute: 0 10*3/uL (ref 0.0–0.1)
Basophils Relative: 0.3 % (ref 0.0–3.0)
Eosinophils Absolute: 0.4 10*3/uL (ref 0.0–0.7)
Eosinophils Relative: 5.6 % — ABNORMAL HIGH (ref 0.0–5.0)
HCT: 44.8 % (ref 36.0–46.0)
Hemoglobin: 15.2 g/dL — ABNORMAL HIGH (ref 12.0–15.0)
Lymphocytes Relative: 27.3 % (ref 12.0–46.0)
Lymphs Abs: 2 10*3/uL (ref 0.7–4.0)
MCHC: 34 g/dL (ref 30.0–36.0)
MCV: 87.3 fl (ref 78.0–100.0)
Monocytes Absolute: 0.5 10*3/uL (ref 0.1–1.0)
Monocytes Relative: 6.5 % (ref 3.0–12.0)
Neutro Abs: 4.5 10*3/uL (ref 1.4–7.7)
Neutrophils Relative %: 60.3 % (ref 43.0–77.0)
Platelets: 258 10*3/uL (ref 150.0–400.0)
RBC: 5.13 Mil/uL — ABNORMAL HIGH (ref 3.87–5.11)
RDW: 13.5 % (ref 11.5–15.5)
WBC: 7.5 10*3/uL (ref 4.0–10.5)

## 2014-12-02 LAB — TSH: TSH: 2.47 u[IU]/mL (ref 0.35–4.50)

## 2014-12-02 LAB — LIPID PANEL
Cholesterol: 231 mg/dL — ABNORMAL HIGH (ref 0–200)
HDL: 52.1 mg/dL (ref >39.00)
LDL Cholesterol: 156 mg/dL — ABNORMAL HIGH (ref 0–99)
NonHDL: 179.09
Total CHOL/HDL Ratio: 4
Triglycerides: 113 mg/dL (ref 0.0–149.0)
VLDL: 22.6 mg/dL (ref 0.0–40.0)

## 2014-12-09 ENCOUNTER — Encounter: Payer: Self-pay | Admitting: Internal Medicine

## 2014-12-09 ENCOUNTER — Ambulatory Visit (INDEPENDENT_AMBULATORY_CARE_PROVIDER_SITE_OTHER): Payer: Managed Care, Other (non HMO) | Admitting: Internal Medicine

## 2014-12-09 VITALS — BP 118/76 | HR 67 | Temp 98.4°F | Ht 64.5 in | Wt 148.2 lb

## 2014-12-09 DIAGNOSIS — E559 Vitamin D deficiency, unspecified: Secondary | ICD-10-CM

## 2014-12-09 DIAGNOSIS — IMO0001 Reserved for inherently not codable concepts without codable children: Secondary | ICD-10-CM

## 2014-12-09 DIAGNOSIS — E7800 Pure hypercholesterolemia, unspecified: Secondary | ICD-10-CM

## 2014-12-09 DIAGNOSIS — Z658 Other specified problems related to psychosocial circumstances: Secondary | ICD-10-CM | POA: Diagnosis not present

## 2014-12-09 DIAGNOSIS — F439 Reaction to severe stress, unspecified: Secondary | ICD-10-CM

## 2014-12-09 DIAGNOSIS — Z0000 Encounter for general adult medical examination without abnormal findings: Secondary | ICD-10-CM | POA: Diagnosis not present

## 2014-12-09 DIAGNOSIS — R030 Elevated blood-pressure reading, without diagnosis of hypertension: Secondary | ICD-10-CM

## 2014-12-09 NOTE — Progress Notes (Signed)
Patient ID: Tammy Daniels, female   DOB: [birth year], 54 y.o.   MRN: [id number]   Subjective:    Patient ID: Tammy Daniels, female    DOB: Feb 01, 1961, 54 y.o.   MRN: [id number]  HPI  Patient here to follow up on her current medical issues as well as for a complete physical exam.  Blood pressures have been averaging 120-130s/70-80.  Trying to stay active. No cardiac symptoms with increased activity or exertion.  No sob.  Discussed diet and exercise.  Feels citalopram is working.  Doing well for her.  Bowels stable.     Past Medical History  Diagnosis Date  . Hypercholesterolemia   . Elevated blood pressure   . Allergic rhinitis     Family history and social history reviewed and unchanged.     Outpatient Encounter Prescriptions as of 12/09/2014  Medication Sig  . CALCIUM PO Take by mouth.  . citalopram (CELEXA) 10 MG tablet TAKE ONE TABLET BY MOUTH ONCE DAILY   No facility-administered encounter medications on file as of 12/09/2014.    Review of Systems  Constitutional: Negative for appetite change and unexpected weight change.  HENT: Negative for congestion and sinus pressure.   Eyes: Negative for pain and visual disturbance.  Respiratory: Negative for cough, chest tightness and shortness of breath.   Cardiovascular: Negative for chest pain, palpitations and leg swelling.  Gastrointestinal: Negative for nausea, vomiting, abdominal pain and diarrhea.  Genitourinary: Negative for dysuria and difficulty urinating.  Musculoskeletal: Negative for back pain and joint swelling.  Skin: Negative for color change and rash.  Neurological: Negative for dizziness, light-headedness and headaches.  Hematological: Negative for adenopathy. Does not bruise/bleed easily.  Psychiatric/Behavioral: Negative for dysphoric mood and agitation.       Objective:     Blood pressure recheck:  136/84  Physical Exam  Constitutional: She is oriented to person, place, and time. She appears well-developed and  well-nourished.  HENT:  Nose: Nose normal.  Mouth/Throat: Oropharynx is clear and moist.  Eyes: Right eye exhibits no discharge. Left eye exhibits no discharge. No scleral icterus.  Neck: Neck supple. No thyromegaly present.  Cardiovascular: Normal rate and regular rhythm.   Pulmonary/Chest: Breath sounds normal. No accessory muscle usage. No tachypnea. No respiratory distress. She has no decreased breath sounds. She has no wheezes. She has no rhonchi. Right breast exhibits no inverted nipple, no mass, no nipple discharge and no tenderness (no axillary adenopathy). Left breast exhibits no inverted nipple, no mass, no nipple discharge and no tenderness (no axilarry adenopathy).  Abdominal: Soft. Bowel sounds are normal. There is no tenderness.  Musculoskeletal: She exhibits no edema or tenderness.  Lymphadenopathy:    She has no cervical adenopathy.  Neurological: She is alert and oriented to person, place, and time.  Skin: Skin is warm. No rash noted.  Psychiatric: She has a normal mood and affect. Her behavior is normal.    BP 118/76 mmHg  Pulse 67  Temp(Src) 98.4 F (36.9 C) (Oral)  Ht 5' 4.5" (1.638 m)  Wt 148 lb 3.2 oz (67.223 kg)  BMI 25.05 kg/m2  SpO2 97% Wt Readings from Last 3 Encounters:  12/09/14 148 lb 3.2 oz (67.223 kg)  08/19/14 148 lb (67.132 kg)  04/09/14 146 lb (66.225 kg)     Lab Results  Component Value Date   WBC 7.5 12/02/2014   HGB 15.2* 12/02/2014   HCT 44.8 12/02/2014   PLT 258.0 12/02/2014   GLUCOSE 88 12/02/2014   CHOL  231* 12/02/2014   TRIG 113.0 12/02/2014   HDL 52.10 12/02/2014   LDLCALC 156* 12/02/2014   ALT 17 12/02/2014   Daniels 17 12/02/2014   NA 142 12/02/2014   K 4.2 12/02/2014   CL 106 12/02/2014   CREATININE 0.62 12/02/2014   BUN 16 12/02/2014   CO2 29 12/02/2014   TSH 2.47 12/02/2014    Mm Digital Screening Bilateral  12/02/2014   CLINICAL DATA:  Screening.  EXAM: DIGITAL SCREENING BILATERAL MAMMOGRAM WITH CAD  COMPARISON:   Previous exam(s).  ACR Breast Density Category c: The breast tissue is heterogeneously dense, which may obscure small masses.  FINDINGS: There are no findings suspicious for malignancy. Images were processed with CAD.  IMPRESSION: No mammographic evidence of malignancy. A result letter of this screening mammogram will be mailed directly to the patient.  RECOMMENDATION: Screening mammogram in one year. (Code:SM-B-01Y)  BI-RADS CATEGORY  1: Negative.   Electronically Signed   By: Edwin Cap M.D.   On: 12/02/2014 14:11       Assessment & Plan:   Problem List Items Addressed This Visit    Elevated blood pressure - Primary    Blood pressures are doing better.  Outside checks as outlined.  Continue to follow.  Notify me if elevation.  Follow metabolic panel.        Relevant Orders   CBC with Differential/Platelet   Comprehensive metabolic panel   Health care maintenance    Physical today 12/09/14.  PAP 11/12/13 - negative with negative HPV.  Mammogram 12/02/14 - Birads I.  Colonoscopy 07/09/13 - normal.        Hypercholesterolemia    Low cholesterol diet and exercise.  Last LDL 156.  Follow.  Discussed cholesterol medication.  She declines.  Wants to work on diet and exericse.  Lab Results  Component Value Date   CHOL 231* 12/02/2014   HDL 52.10 12/02/2014   LDLCALC 156* 12/02/2014   TRIG 113.0 12/02/2014   CHOLHDL 4 12/02/2014        Relevant Orders   Lipid panel   Stress    On citalopram.  Appears to be doing better.  Follow.        Vitamin D deficiency    Follow vitamin d level.            Dale Valmy, MD

## 2014-12-09 NOTE — Progress Notes (Signed)
Pre visit review using our clinic review tool, if applicable. No additional management support is needed unless otherwise documented below in the visit note. 

## 2014-12-11 ENCOUNTER — Encounter: Payer: Self-pay | Admitting: Internal Medicine

## 2014-12-11 NOTE — Assessment & Plan Note (Signed)
On citalopram.  Appears to be doing better.  Follow.

## 2014-12-11 NOTE — Assessment & Plan Note (Signed)
Physical today 12/09/14.  PAP 11/12/13 - negative with negative HPV.  Mammogram 12/02/14 - Birads I.  Colonoscopy 07/09/13 - normal.

## 2014-12-11 NOTE — Assessment & Plan Note (Signed)
Follow vitamin d level.   

## 2014-12-11 NOTE — Assessment & Plan Note (Signed)
Blood pressures are doing better.  Outside checks as outlined.  Continue to follow.  Notify me if elevation.  Follow metabolic panel.

## 2014-12-11 NOTE — Assessment & Plan Note (Addendum)
Low cholesterol diet and exercise.  Last LDL 156.  Follow.  Discussed cholesterol medication.  She declines.  Wants to work on diet and exericse.  Lab Results  Component Value Date   CHOL 231* 12/02/2014   HDL 52.10 12/02/2014   LDLCALC 156* 12/02/2014   TRIG 113.0 12/02/2014   CHOLHDL 4 12/02/2014

## 2015-01-21 ENCOUNTER — Other Ambulatory Visit: Payer: Self-pay | Admitting: Internal Medicine

## 2015-06-16 ENCOUNTER — Other Ambulatory Visit (INDEPENDENT_AMBULATORY_CARE_PROVIDER_SITE_OTHER): Payer: Managed Care, Other (non HMO)

## 2015-06-16 ENCOUNTER — Encounter (INDEPENDENT_AMBULATORY_CARE_PROVIDER_SITE_OTHER): Payer: Self-pay

## 2015-06-16 DIAGNOSIS — R030 Elevated blood-pressure reading, without diagnosis of hypertension: Secondary | ICD-10-CM | POA: Diagnosis not present

## 2015-06-16 DIAGNOSIS — E7800 Pure hypercholesterolemia, unspecified: Secondary | ICD-10-CM

## 2015-06-16 DIAGNOSIS — IMO0001 Reserved for inherently not codable concepts without codable children: Secondary | ICD-10-CM

## 2015-06-16 LAB — CBC WITH DIFFERENTIAL/PLATELET
Basophils Absolute: 0 10*3/uL (ref 0.0–0.1)
Basophils Relative: 0.6 % (ref 0.0–3.0)
Eosinophils Absolute: 0.4 10*3/uL (ref 0.0–0.7)
Eosinophils Relative: 5 % (ref 0.0–5.0)
HCT: 44.2 % (ref 36.0–46.0)
Hemoglobin: 15.2 g/dL — ABNORMAL HIGH (ref 12.0–15.0)
Lymphocytes Relative: 29.4 % (ref 12.0–46.0)
Lymphs Abs: 2.4 10*3/uL (ref 0.7–4.0)
MCHC: 34.3 g/dL (ref 30.0–36.0)
MCV: 86.9 fl (ref 78.0–100.0)
Monocytes Absolute: 0.5 10*3/uL (ref 0.1–1.0)
Monocytes Relative: 6.3 % (ref 3.0–12.0)
Neutro Abs: 4.8 10*3/uL (ref 1.4–7.7)
Neutrophils Relative %: 58.7 % (ref 43.0–77.0)
Platelets: 242 10*3/uL (ref 150.0–400.0)
RBC: 5.09 Mil/uL (ref 3.87–5.11)
RDW: 13 % (ref 11.5–15.5)
WBC: 8.2 10*3/uL (ref 4.0–10.5)

## 2015-06-16 LAB — COMPREHENSIVE METABOLIC PANEL
ALT: 18 U/L (ref 0–35)
AST: 19 U/L (ref 0–37)
Albumin: 4.3 g/dL (ref 3.5–5.2)
Alkaline Phosphatase: 81 U/L (ref 39–117)
BUN: 17 mg/dL (ref 6–23)
CO2: 30 mEq/L (ref 19–32)
Calcium: 9.2 mg/dL (ref 8.4–10.5)
Chloride: 106 mEq/L (ref 96–112)
Creatinine, Ser: 0.64 mg/dL (ref 0.40–1.20)
GFR: 102.1 mL/min (ref >60.00)
Glucose, Bld: 103 mg/dL — ABNORMAL HIGH (ref 70–99)
Potassium: 4.4 mEq/L (ref 3.5–5.1)
Sodium: 140 mEq/L (ref 135–145)
Total Bilirubin: 0.5 mg/dL (ref 0.2–1.2)
Total Protein: 7 g/dL (ref 6.0–8.3)

## 2015-06-16 LAB — LIPID PANEL
Cholesterol: 249 mg/dL — ABNORMAL HIGH (ref 0–200)
HDL: 56.1 mg/dL (ref >39.00)
LDL Cholesterol: 159 mg/dL — ABNORMAL HIGH (ref 0–99)
NonHDL: 193
Total CHOL/HDL Ratio: 4
Triglycerides: 170 mg/dL — ABNORMAL HIGH (ref 0.0–149.0)
VLDL: 34 mg/dL (ref 0.0–40.0)

## 2015-06-17 ENCOUNTER — Encounter: Payer: Self-pay | Admitting: Internal Medicine

## 2015-06-17 ENCOUNTER — Ambulatory Visit (INDEPENDENT_AMBULATORY_CARE_PROVIDER_SITE_OTHER): Payer: Managed Care, Other (non HMO) | Admitting: Internal Medicine

## 2015-06-17 VITALS — BP 118/80 | HR 61 | Temp 98.1°F | Resp 18 | Ht 64.5 in | Wt 147.2 lb

## 2015-06-17 DIAGNOSIS — Z658 Other specified problems related to psychosocial circumstances: Secondary | ICD-10-CM | POA: Diagnosis not present

## 2015-06-17 DIAGNOSIS — F439 Reaction to severe stress, unspecified: Secondary | ICD-10-CM

## 2015-06-17 DIAGNOSIS — E7800 Pure hypercholesterolemia, unspecified: Secondary | ICD-10-CM

## 2015-06-17 DIAGNOSIS — R030 Elevated blood-pressure reading, without diagnosis of hypertension: Secondary | ICD-10-CM | POA: Diagnosis not present

## 2015-06-17 DIAGNOSIS — IMO0001 Reserved for inherently not codable concepts without codable children: Secondary | ICD-10-CM

## 2015-06-17 MED ORDER — ROSUVASTATIN CALCIUM 5 MG PO TABS: 5.0000 mg | ORAL_TABLET | Freq: Every day | ORAL | Status: DC

## 2015-06-17 NOTE — Progress Notes (Signed)
Pre-visit discussion using our clinic review tool. No additional management support is needed unless otherwise documented below in the visit note.  

## 2015-06-17 NOTE — Progress Notes (Signed)
Patient ID: Tammy Daniels, female   DOB: March 11, 1961, 55 y.o.   MRN: [id number]   Subjective:    Patient ID: Tammy Daniels, female    DOB: [birth year], 55 y.o.   MRN: [id number]  HPI  Patient with past history hypercholesterolemia and increased stress.  She comes in today to follow up on these issues.  Her blood pressure has been averaging 120-130s/70-80s.  On no medication.  She tries to stay active.  No cardiac symptoms with increased activity or exertion.  No chest pain or tightness.  No sob.  No acid reflux reported.  Handling stress well.  Taking citalopram.  Feels this works well for her.  Discussed elevated cholesterol.  Discussed treatment.  She agreed to start crestor.  Is going to [facility].  S/p intervention on her legs.  Doing well.  Feels better.     Past Medical History  Diagnosis Date  . Hypercholesterolemia   . Elevated blood pressure   . Allergic rhinitis    Past Surgical History  Procedure Laterality Date  . Dilation and curettage, diagnostic / therapeutic  1992  . Hysteroscopy  1997    with cervical polyp removal  . Explaratory lap  1992   Family History  Problem Relation Age of Onset  . Hyperlipidemia Mother   . Hypertension Mother   . Heart disease Father     died 30 MI  . Hypertension Brother   . Hypothyroidism Sister    Social History   Social History  . Marital Status: Married    Spouse Name: N/A  . Number of Children: 3  . Years of Education: N/A   Occupational History  .     Social History Main Topics  . Smoking status: Never Smoker   . Smokeless tobacco: Never Used  . Alcohol Use: 0.0 oz/week    0 Standard drinks or equivalent per week  . Drug Use: No  . Sexual Activity: Not Asked   Other Topics Concern  . None   Social History Narrative    Outpatient Encounter Prescriptions as of 06/17/2015  Medication Sig  . CALCIUM PO Take by mouth.  . citalopram (CELEXA) 10 MG tablet TAKE ONE TABLET BY MOUTH ONCE DAILY  .  rosuvastatin (CRESTOR) 5 MG tablet Take 1 tablet (5 mg total) by mouth daily.   No facility-administered encounter medications on file as of 06/17/2015.    Review of Systems  Constitutional: Negative for appetite change and unexpected weight change.  HENT: Negative for congestion and sinus pressure.   Respiratory: Negative for cough, chest tightness and shortness of breath.   Cardiovascular: Negative for chest pain, palpitations and leg swelling.  Gastrointestinal: Negative for nausea, vomiting, abdominal pain and diarrhea.  Genitourinary: Negative for dysuria and difficulty urinating.  Skin: Negative for color change and rash.  Neurological: Negative for dizziness, light-headedness and headaches.  Psychiatric/Behavioral: Negative for dysphoric mood and agitation.       Objective:    Physical Exam  Constitutional: She appears well-developed and well-nourished. No distress.  HENT:  Nose: Nose normal.  Mouth/Throat: Oropharynx is clear and moist.  Eyes: Conjunctivae are normal. Right eye exhibits no discharge. Left eye exhibits no discharge.  Neck: Neck supple. No thyromegaly present.  Cardiovascular: Normal rate and regular rhythm.   Pulmonary/Chest: Breath sounds normal. No respiratory distress. She has no wheezes.  Abdominal: Soft. Bowel sounds are normal. There is no tenderness.  Musculoskeletal: She exhibits no edema or tenderness.  Lymphadenopathy:  She has no cervical adenopathy.  Skin: No rash noted. No erythema.  Psychiatric: She has a normal mood and affect. Her behavior is normal.    BP 118/80 mmHg  Pulse 61  Temp(Src) 98.1 F (36.7 C) (Oral)  Resp 18  Ht 5' 4.5" (1.638 m)  Wt 147 lb 4 oz (66.792 kg)  BMI 24.89 kg/m2  SpO2 95% Wt Readings from Last 3 Encounters:  06/17/15 147 lb 4 oz (66.792 kg)  12/09/14 148 lb 3.2 oz (67.223 kg)  08/19/14 148 lb (67.132 kg)     Lab Results  Component Value Date   WBC 8.2 06/16/2015   HGB 15.2* 06/16/2015   HCT 44.2  06/16/2015   PLT 242.0 06/16/2015   GLUCOSE 103* 06/16/2015   CHOL 249* 06/16/2015   TRIG 170.0* 06/16/2015   HDL 56.10 06/16/2015   LDLCALC 159* 06/16/2015   ALT 18 06/16/2015   AST 19 06/16/2015   NA 140 06/16/2015   K 4.4 06/16/2015   CL 106 06/16/2015   CREATININE 0.64 06/16/2015   BUN 17 06/16/2015   CO2 30 06/16/2015   TSH 2.47 12/02/2014    Mm Digital Screening Bilateral  12/02/2014  CLINICAL DATA:  Screening. EXAM: DIGITAL SCREENING BILATERAL MAMMOGRAM WITH CAD COMPARISON:  Previous exam(s). ACR Breast Density Category c: The breast tissue is heterogeneously dense, which may obscure small masses. FINDINGS: There are no findings suspicious for malignancy. Images were processed with CAD. IMPRESSION: No mammographic evidence of malignancy. A result letter of this screening mammogram will be mailed directly to the patient. RECOMMENDATION: Screening mammogram in one year. (Code:SM-B-01Y) BI-RADS CATEGORY  1: Negative. Electronically Signed   By: Edwin Cap M.D.   On: 12/02/2014 14:11       Assessment & Plan:   Problem List Items Addressed This Visit    Elevated blood pressure    Blood pressure as outlined.  Doing well on no medication.  Follow.        Hypercholesterolemia - Primary    Low cholesterol diet and exercise.  Discussed recent elevated LDL.  Discussed treatment.  Start crestor  as directed.  Check liver panel in 6 weeks.        Relevant Medications   rosuvastatin (CRESTOR) 5 MG tablet   Other Relevant Orders   Hepatic function panel   Stress    Increased stress - on citalopram.  Doing well on this dose.  Follow.          I spent 25 minutes with the patient and more than 50% of the time was spent in consultation regarding the above.     Dale Americus, MD

## 2015-06-21 ENCOUNTER — Encounter: Payer: Self-pay | Admitting: Internal Medicine

## 2015-06-21 NOTE — Assessment & Plan Note (Signed)
Low cholesterol diet and exercise.  Discussed recent elevated LDL.  Discussed treatment.  Start crestor  as directed.  Check liver panel in 6 weeks.

## 2015-06-21 NOTE — Assessment & Plan Note (Signed)
Increased stress - on citalopram.  Doing well on this dose.  Follow.

## 2015-06-21 NOTE — Assessment & Plan Note (Signed)
Blood pressure as outlined.  Doing well on no medication.  Follow.

## 2015-07-29 ENCOUNTER — Other Ambulatory Visit: Payer: Managed Care, Other (non HMO)

## 2015-08-18 ENCOUNTER — Other Ambulatory Visit: Payer: Self-pay | Admitting: Internal Medicine

## 2015-08-19 ENCOUNTER — Other Ambulatory Visit (INDEPENDENT_AMBULATORY_CARE_PROVIDER_SITE_OTHER): Payer: Managed Care, Other (non HMO)

## 2015-08-19 DIAGNOSIS — E7800 Pure hypercholesterolemia, unspecified: Secondary | ICD-10-CM

## 2015-08-19 LAB — HEPATIC FUNCTION PANEL
ALT: 17 U/L (ref 0–35)
AST: 17 U/L (ref 0–37)
Albumin: 4.2 g/dL (ref 3.5–5.2)
Alkaline Phosphatase: 85 U/L (ref 39–117)
Bilirubin, Direct: 0.1 mg/dL (ref 0.0–0.3)
Total Bilirubin: 0.5 mg/dL (ref 0.2–1.2)
Total Protein: 7.1 g/dL (ref 6.0–8.3)

## 2015-08-20 ENCOUNTER — Encounter: Payer: Self-pay | Admitting: *Deleted

## 2015-08-20 ENCOUNTER — Other Ambulatory Visit: Payer: Self-pay | Admitting: Internal Medicine

## 2015-08-20 DIAGNOSIS — E7800 Pure hypercholesterolemia, unspecified: Secondary | ICD-10-CM

## 2015-08-20 NOTE — Progress Notes (Signed)
Order placed for f/u labs.  

## 2015-10-06 ENCOUNTER — Other Ambulatory Visit: Payer: Self-pay | Admitting: Internal Medicine

## 2015-11-03 ENCOUNTER — Other Ambulatory Visit: Payer: Self-pay | Admitting: Internal Medicine

## 2015-12-15 ENCOUNTER — Other Ambulatory Visit: Payer: Self-pay | Admitting: Internal Medicine

## 2015-12-16 ENCOUNTER — Ambulatory Visit (INDEPENDENT_AMBULATORY_CARE_PROVIDER_SITE_OTHER): Payer: Managed Care, Other (non HMO) | Admitting: Internal Medicine

## 2015-12-16 ENCOUNTER — Encounter: Payer: Self-pay | Admitting: Internal Medicine

## 2015-12-16 VITALS — BP 120/80 | HR 64 | Temp 98.2°F | Resp 18 | Ht 64.5 in | Wt 146.4 lb

## 2015-12-16 DIAGNOSIS — Z0000 Encounter for general adult medical examination without abnormal findings: Secondary | ICD-10-CM | POA: Diagnosis not present

## 2015-12-16 DIAGNOSIS — E7800 Pure hypercholesterolemia, unspecified: Secondary | ICD-10-CM | POA: Diagnosis not present

## 2015-12-16 DIAGNOSIS — IMO0001 Reserved for inherently not codable concepts without codable children: Secondary | ICD-10-CM

## 2015-12-16 DIAGNOSIS — F439 Reaction to severe stress, unspecified: Secondary | ICD-10-CM

## 2015-12-16 DIAGNOSIS — R030 Elevated blood-pressure reading, without diagnosis of hypertension: Secondary | ICD-10-CM

## 2015-12-16 DIAGNOSIS — E559 Vitamin D deficiency, unspecified: Secondary | ICD-10-CM

## 2015-12-16 DIAGNOSIS — Z1239 Encounter for other screening for malignant neoplasm of breast: Secondary | ICD-10-CM

## 2015-12-16 DIAGNOSIS — R0989 Other specified symptoms and signs involving the circulatory and respiratory systems: Secondary | ICD-10-CM

## 2015-12-16 DIAGNOSIS — Z658 Other specified problems related to psychosocial circumstances: Secondary | ICD-10-CM

## 2015-12-16 MED ORDER — CITALOPRAM HYDROBROMIDE 10 MG PO TABS: 10.0000 mg | ORAL_TABLET | Freq: Every day | ORAL | 2 refills | Status: DC

## 2015-12-16 NOTE — Progress Notes (Signed)
Patient ID: Tammy Daniels, female   DOB: 08/16/1960, 55 y.o.   MRN: [id number]   Subjective:    Patient ID: Tammy Daniels, female    DOB: 12/04/1960, 55 y.o.   MRN: [id number]  HPI  Patient here for her physical exam.  She states she is doing well.  Feels good.  [facility]  Went snorkeling.  No chest pain.  Tries to stay active.  No sob.  No acid reflux.  No abdominal pain or cramping.  Bowels stable.  Some increased stress.  Handling things relatively well.  Desires no further intervention.     Past Medical History:  Diagnosis Date  . Allergic rhinitis   . Elevated blood pressure   . Hypercholesterolemia    Past Surgical History:  Procedure Laterality Date  . DILATION AND CURETTAGE, DIAGNOSTIC / THERAPEUTIC  1992  . explaratory lap  1992  . HYSTEROSCOPY  1997   with cervical polyp removal   Family History  Problem Relation Age of Onset  . Hyperlipidemia Mother   . Hypertension Mother   . Heart disease Father     died 7558 MI  . Hypertension Brother   . Hypothyroidism Sister    Social History   Social History  . Marital status: Married    Spouse name: N/A  . Number of children: 3  . Years of education: N/A   Occupational History  .  [employer]   Social History Main Topics  . Smoking status: Never Smoker  . Smokeless tobacco: Never Used  . Alcohol use 0.0 oz/week  . Drug use: No  . Sexual activity: Not Asked   Other Topics Concern  . None   Social History Narrative  . None    Outpatient Encounter Prescriptions as of 12/16/2015  Medication Sig  . CALCIUM PO Take by mouth.  . citalopram (CELEXA) 10 MG tablet Take 1 tablet (10 mg total) by mouth daily.  . rosuvastatin (CRESTOR) 5 MG tablet TAKE ONE TABLET BY MOUTH ONCE DAILY  . [DISCONTINUED] citalopram (CELEXA) 10 MG tablet TAKE ONE TABLET BY MOUTH ONCE DAILY   No facility-administered encounter medications on file as of 12/16/2015.     Review of Systems  Constitutional: Negative for  appetite change and unexpected weight change.  HENT: Negative for congestion and sinus pressure.   Eyes: Negative for pain and visual disturbance.  Respiratory: Negative for cough, chest tightness and shortness of breath.   Cardiovascular: Negative for chest pain, palpitations and leg swelling.  Gastrointestinal: Negative for abdominal pain, diarrhea, nausea and vomiting.  Genitourinary: Negative for difficulty urinating and dysuria.  Musculoskeletal: Negative for back pain and joint swelling.  Skin: Negative for color change and rash.  Neurological: Negative for dizziness, light-headedness and headaches.  Hematological: Negative for adenopathy. Does not bruise/bleed easily.  Psychiatric/Behavioral: Negative for agitation and dysphoric mood.       Objective:    Physical Exam  Constitutional: She is oriented to person, place, and time. She appears well-developed and well-nourished. No distress.  HENT:  Nose: Nose normal.  Mouth/Throat: Oropharynx is clear and moist.  Eyes: Right eye exhibits no discharge. Left eye exhibits no discharge. No scleral icterus.  Neck: Neck supple. No thyromegaly present.  Cardiovascular: Normal rate and regular rhythm.   Pulmonary/Chest: Breath sounds normal. No accessory muscle usage. No tachypnea. No respiratory distress. She has no decreased breath sounds. She has no wheezes. She has no rhonchi. Right breast exhibits no inverted nipple, no  mass, no nipple discharge and no tenderness (no axillary adenopathy). Left breast exhibits no inverted nipple, no mass, no nipple discharge and no tenderness (no axilarry adenopathy).  Abdominal: Soft. Bowel sounds are normal. There is no tenderness.  Audible abdominal bruit.    Musculoskeletal: She exhibits no edema or tenderness.  Lymphadenopathy:    She has no cervical adenopathy.  Neurological: She is alert and oriented to person, place, and time.  Skin: Skin is warm. No rash noted. No erythema.  Psychiatric: She  has a normal mood and affect. Her behavior is normal.    BP 120/80   Pulse 64   Temp 98.2 F (36.8 C) (Oral)   Resp 18   Ht 5' 4.5" (1.638 m)   Wt 146 lb 6 oz (66.4 kg)   SpO2 98%   BMI 24.74 kg/m  Wt Readings from Last 3 Encounters:  12/16/15 146 lb 6 oz (66.4 kg)  06/17/15 147 lb 4 oz (66.8 kg)  12/09/14 148 lb 3.2 oz (67.2 kg)     Lab Results  Component Value Date   WBC 8.2 06/16/2015   HGB 15.2 (H) 06/16/2015   HCT 44.2 06/16/2015   PLT 242.0 06/16/2015   GLUCOSE 103 (H) 06/16/2015   CHOL 249 (H) 06/16/2015   TRIG 170.0 (H) 06/16/2015   HDL 56.10 06/16/2015   LDLCALC 159 (H) 06/16/2015   ALT 17 08/19/2015   AST 17 08/19/2015   NA 140 06/16/2015   K 4.4 06/16/2015   CL 106 06/16/2015   CREATININE 0.64 06/16/2015   BUN 17 06/16/2015   CO2 30 06/16/2015   TSH 2.47 12/02/2014    Mm Digital Screening Bilateral  Result Date: 12/02/2014 CLINICAL DATA:  Screening. EXAM: DIGITAL SCREENING BILATERAL MAMMOGRAM WITH CAD COMPARISON:  Previous exam(s). ACR Breast Density Category c: The breast tissue is heterogeneously dense, which may obscure small masses. FINDINGS: There are no findings suspicious for malignancy. Images were processed with CAD. IMPRESSION: No mammographic evidence of malignancy. A result letter of this screening mammogram will be mailed directly to the patient. RECOMMENDATION: Screening mammogram in one year. (Code:SM-B-01Y) BI-RADS CATEGORY  1: Negative. Electronically Signed   By: Edwin Cap M.D.   On: 12/02/2014 14:11       Assessment & Plan:   Problem List Items Addressed This Visit    Abdominal bruit    Obtain aortic ultrasound.  She wants to check her insurance.  Will call me and let me know where to schedule.        Elevated blood pressure    On my initial check blood pressure elevated.  Recheck 136/82.  Follow.  Have her spot check her pressure.        Health care maintenance    Physical today 12/16/15.  PAP 11/12/13 - negative with  negative HPV.  Mammogram 12/02/14 - Birads I.  Scheduled for f/u mammogram.  07/09/13 - colonoscopy normal.        Hypercholesterolemia    On crestor and tolerating.  Low cholesterol diet and exercise.  Follow lipid panel and liver function tests.        Stress    On citalopram and doing well.  Follow.        Vitamin D deficiency    Follow vitamin D level.         Other Visit Diagnoses    Routine general medical examination at a health care facility    -  Primary   Screening breast examination  Relevant Orders   MM Digital Screening       Dale DurhamSCOTT, Eliah Marquard, MD

## 2015-12-16 NOTE — Assessment & Plan Note (Signed)
Physical today 12/16/15.  PAP 11/12/13 - negative with negative HPV.  Mammogram 12/02/14 - Birads I.  Scheduled for f/u mammogram.  07/09/13 - colonoscopy normal.

## 2015-12-16 NOTE — Assessment & Plan Note (Signed)
On crestor and tolerating.  Low cholesterol diet and exercise.  Follow lipid panel and liver function tests.   

## 2015-12-16 NOTE — Assessment & Plan Note (Signed)
On citalopram and doing well.  Follow.   

## 2015-12-16 NOTE — Progress Notes (Signed)
Pre-visit discussion using our clinic review tool. No additional management support is needed unless otherwise documented below in the visit note.  

## 2015-12-16 NOTE — Assessment & Plan Note (Signed)
Follow vitamin D level.  

## 2015-12-17 ENCOUNTER — Encounter: Payer: Self-pay | Admitting: Internal Medicine

## 2015-12-17 ENCOUNTER — Telehealth: Payer: Self-pay | Admitting: Internal Medicine

## 2015-12-17 DIAGNOSIS — R0989 Other specified symptoms and signs involving the circulatory and respiratory systems: Secondary | ICD-10-CM | POA: Insufficient documentation

## 2015-12-17 NOTE — Telephone Encounter (Signed)
Pt called stating she would like to get the aortic ultrasound at the hospital. Tues or Wednesday. Thank you!  Call pt @ [phone].

## 2015-12-17 NOTE — Assessment & Plan Note (Signed)
On my initial check blood pressure elevated.  Recheck 136/82.  Follow.  Have her spot check her pressure.

## 2015-12-17 NOTE — Telephone Encounter (Signed)
Please advise 

## 2015-12-17 NOTE — Assessment & Plan Note (Signed)
Obtain aortic ultrasound.  She wants to check her insurance.  Will call me and let me know where to schedule.

## 2015-12-18 NOTE — Telephone Encounter (Signed)
I have placed the order for the ultrasound.  Pt wants at [facility].  She was to call and let me know where she wanted scheduled.  Prefers tues or Wednesday.

## 2015-12-18 NOTE — Telephone Encounter (Signed)
Order placed for ultrasound

## 2015-12-18 NOTE — Telephone Encounter (Signed)
No order for aortic ultrasound in her chart. Please advise.

## 2015-12-29 ENCOUNTER — Other Ambulatory Visit (INDEPENDENT_AMBULATORY_CARE_PROVIDER_SITE_OTHER): Payer: Managed Care, Other (non HMO)

## 2015-12-29 ENCOUNTER — Ambulatory Visit
Admission: RE | Admit: 2015-12-29 | Discharge: 2015-12-29 | Disposition: A | Discharge status: Home / Self Care | Payer: Managed Care, Other (non HMO) | Source: Ambulatory Visit | Attending: Internal Medicine | Admitting: Internal Medicine

## 2015-12-29 DIAGNOSIS — I77811 Abdominal aortic ectasia: Secondary | ICD-10-CM | POA: Diagnosis not present

## 2015-12-29 DIAGNOSIS — E7800 Pure hypercholesterolemia, unspecified: Secondary | ICD-10-CM | POA: Diagnosis not present

## 2015-12-29 DIAGNOSIS — R0989 Other specified symptoms and signs involving the circulatory and respiratory systems: Secondary | ICD-10-CM | POA: Diagnosis present

## 2015-12-29 LAB — COMPREHENSIVE METABOLIC PANEL
ALT: 20 U/L (ref 0–35)
AST: 18 U/L (ref 0–37)
Albumin: 4.3 g/dL (ref 3.5–5.2)
Alkaline Phosphatase: 92 U/L (ref 39–117)
BUN: 18 mg/dL (ref 6–23)
CO2: 30 mEq/L (ref 19–32)
Calcium: 8.9 mg/dL (ref 8.4–10.5)
Chloride: 106 mEq/L (ref 96–112)
Creatinine, Ser: 0.7 mg/dL (ref 0.40–1.20)
GFR: 91.89 mL/min (ref >60.00)
Glucose, Bld: 95 mg/dL (ref 70–99)
Potassium: 4.3 mEq/L (ref 3.5–5.1)
Sodium: 140 mEq/L (ref 135–145)
Total Bilirubin: 0.6 mg/dL (ref 0.2–1.2)
Total Protein: 7.2 g/dL (ref 6.0–8.3)

## 2015-12-29 LAB — LIPID PANEL
Cholesterol: 196 mg/dL (ref 0–200)
HDL: 58.7 mg/dL (ref >39.00)
LDL Cholesterol: 112 mg/dL — ABNORMAL HIGH (ref 0–99)
NonHDL: 136.98
Total CHOL/HDL Ratio: 3
Triglycerides: 125 mg/dL (ref 0.0–149.0)
VLDL: 25 mg/dL (ref 0.0–40.0)

## 2015-12-30 ENCOUNTER — Ambulatory Visit: Payer: Managed Care, Other (non HMO)

## 2016-01-28 ENCOUNTER — Encounter: Payer: Self-pay | Admitting: Internal Medicine

## 2016-02-25 ENCOUNTER — Ambulatory Visit: Payer: Managed Care, Other (non HMO)

## 2016-03-15 ENCOUNTER — Encounter: Payer: Self-pay | Admitting: Internal Medicine

## 2016-03-15 ENCOUNTER — Ambulatory Visit (INDEPENDENT_AMBULATORY_CARE_PROVIDER_SITE_OTHER): Payer: BLUE CROSS/BLUE SHIELD | Admitting: Internal Medicine

## 2016-03-15 DIAGNOSIS — I10 Essential (primary) hypertension: Secondary | ICD-10-CM | POA: Diagnosis not present

## 2016-03-15 DIAGNOSIS — F439 Reaction to severe stress, unspecified: Secondary | ICD-10-CM

## 2016-03-15 DIAGNOSIS — E7800 Pure hypercholesterolemia, unspecified: Secondary | ICD-10-CM

## 2016-03-15 DIAGNOSIS — I77811 Abdominal aortic ectasia: Secondary | ICD-10-CM

## 2016-03-15 MED ORDER — LISINOPRIL 10 MG PO TABS: 10.0000 mg | ORAL_TABLET | Freq: Every day | ORAL | 1 refills | Status: DC

## 2016-03-15 NOTE — Progress Notes (Signed)
Pre visit review using our clinic review tool, if applicable. No additional management support is needed unless otherwise documented below in the visit note. 

## 2016-03-15 NOTE — Progress Notes (Signed)
Patient ID: Tammy Daniels, female   DOB: 05/06/1960, 55 y.o.   MRN: [id number]   Subjective:    Patient ID: Tammy Daniels, female    DOB: 05/27/1960, 55 y.o.   MRN: [id number]  HPI  Patient here for a scheduled follow up.  She reports she is doing well.  Feels good.  On citalopram.  Handling stress.  Brings in blood pressure readings.  Blood pressure averaging 120-140s/70-80s.  No chest pain.  No sob.  No acid reflux.  No abdominal pain or cramping.  Bowels stable.  Not on any blood pressure medication.     Past Medical History:  Diagnosis Date  . Allergic rhinitis   . Elevated blood pressure   . Hypercholesterolemia    Past Surgical History:  Procedure Laterality Date  . DILATION AND CURETTAGE, DIAGNOSTIC / THERAPEUTIC  1992  . explaratory lap  1992  . HYSTEROSCOPY  1997   with cervical polyp removal   Family History  Problem Relation Age of Onset  . Hyperlipidemia Mother   . Hypertension Mother   . Heart disease Father     died 858 MI  . Hypertension Brother   . Hypothyroidism Sister    Social History   Social History  . Marital status: Married    Spouse name: N/A  . Number of children: 3  . Years of education: N/A   Occupational History  .  [employer]   Social History Main Topics  . Smoking status: Never Smoker  . Smokeless tobacco: Never Used  . Alcohol use 0.0 oz/week  . Drug use: No  . Sexual activity: Not Asked   Other Topics Concern  . None   Social History Narrative  . None    Outpatient Encounter Prescriptions as of 03/15/2016  Medication Sig  . CALCIUM PO Take by mouth.  . citalopram (CELEXA) 10 MG tablet Take 1 tablet (10 mg total) by mouth daily.  . rosuvastatin (CRESTOR) 5 MG tablet TAKE ONE TABLET BY MOUTH ONCE DAILY  . lisinopril (PRINIVIL,ZESTRIL) 10 MG tablet Take 1 tablet (10 mg total) by mouth daily.   No facility-administered encounter medications on file as of 03/15/2016.     Review of Systems  Constitutional: Negative for  appetite change and unexpected weight change.  HENT: Negative for congestion and sinus pressure.   Respiratory: Negative for cough, chest tightness and shortness of breath.   Cardiovascular: Negative for chest pain, palpitations and leg swelling.  Gastrointestinal: Negative for abdominal pain, diarrhea, nausea and vomiting.  Genitourinary: Negative for difficulty urinating and dysuria.  Musculoskeletal: Negative for back pain and joint swelling.  Skin: Negative for color change and rash.  Neurological: Negative for dizziness, light-headedness and headaches.  Psychiatric/Behavioral: Negative for agitation and dysphoric mood.       Objective:    Physical Exam  Constitutional: She appears well-developed and well-nourished. No distress.  HENT:  Nose: Nose normal.  Mouth/Throat: Oropharynx is clear and moist.  Neck: Neck supple. No thyromegaly present.  Cardiovascular: Normal rate and regular rhythm.   Pulmonary/Chest: Breath sounds normal. No respiratory distress. She has no wheezes.  Abdominal: Soft. Bowel sounds are normal. There is no tenderness.  Musculoskeletal: She exhibits no edema or tenderness.  Lymphadenopathy:    She has no cervical adenopathy.  Skin: No rash noted. No erythema.  Psychiatric: She has a normal mood and affect. Her behavior is normal.    BP (!) 142/82   Pulse 95   Temp 98 F (36.7  C) (Oral)   Ht 5\' 5"  (1.651 m)   Wt 148 lb 3.2 oz (67.2 kg)   SpO2 98%   BMI 24.66 kg/m  Wt Readings from Last 3 Encounters:  03/15/16 148 lb 3.2 oz (67.2 kg)  12/16/15 146 lb 6 oz (66.4 kg)  06/17/15 147 lb 4 oz (66.8 kg)     Lab Results  Component Value Date   WBC 8.2 06/16/2015   HGB 15.2 (H) 06/16/2015   HCT 44.2 06/16/2015   PLT 242.0 06/16/2015   GLUCOSE 95 12/29/2015   CHOL 196 12/29/2015   TRIG 125.0 12/29/2015   HDL 58.70 12/29/2015   LDLCALC 112 (H) 12/29/2015   ALT 20 12/29/2015   AST 18 12/29/2015   NA 140 12/29/2015   K 4.3 12/29/2015   CL 106  12/29/2015   CREATININE 0.70 12/29/2015   BUN 18 12/29/2015   CO2 30 12/29/2015   TSH 2.47 12/02/2014    [place] Retroperitoneal Comp  Result Date: 12/29/2015 CLINICAL DATA:  Bruit.  Elevated blood pressure . EXAM: ULTRASOUND RETROPERITONEAL COMPLETE TECHNIQUE: Ultrasound examination of the abdominal aorta was performed to evaluate for abdominal aortic aneurysm. The common iliac arteries, IVC, and kidneys were also evaluated. COMPARISON:  No recent prior. FINDINGS: Abdominal Aorta Mild ectasia 2.6 cm.  No aneurysm. Maximum AP Diameter:  2.4 cm Maximum TRV Diameter: 2.6 cm Right Common Iliac Artery No aneurysm identified. Left Common Iliac Artery No aneurysm identified. IVC No abnormality visualized. Right Kidney Length: 10.5 cm Echogenicity within normal limits. No mass or hydronephrosis visualized. Left Kidney Length: 10.2 cm Echogenicity within normal limits. No mass or hydronephrosis visualized. IMPRESSION: Mild abdominal aortic ectasia 2.6 cm. Ectatic abdominal aorta at risk for aneurysm development. Recommend followup by ultrasound in 5 years. This recommendation follows ACR consensus guidelines: White Paper of the ACR Incidental Findings Committee II on Vascular Findings. J Am Coll Radiol 2013; 10:789-794. Electronically Signed   By: Maisie Fushomas  Register   On: 12/29/2015 09:29       Assessment & Plan:   Problem List Items Addressed This Visit    Abdominal aortic ectasia (HCC)    Found on ultrasound.  Recommended f/u aortic ultrasound in 5 years.  (due 2022).        Relevant Medications   lisinopril (PRINIVIL,ZESTRIL) 10 MG tablet   Essential hypertension    Blood pressure remaining slightly elevated.  Start lisinopril 10mg  q day.  Follow pressures.  Follow metabolic panel.        Relevant Medications   lisinopril (PRINIVIL,ZESTRIL) 10 MG tablet   Other Relevant Orders   Basic metabolic panel   Hypercholesterolemia    On crestor.  Tolerating.  Follow lipid panel and liver function tests.   Low cholesterol diet and exercise.        Relevant Medications   lisinopril (PRINIVIL,ZESTRIL) 10 MG tablet   Stress    Doing well on citalopram.  Follow.           Dale DurhamSCOTT, Dhana Totton, MD

## 2016-03-19 ENCOUNTER — Encounter: Payer: Self-pay | Admitting: Internal Medicine

## 2016-03-19 DIAGNOSIS — I77811 Abdominal aortic ectasia: Secondary | ICD-10-CM | POA: Insufficient documentation

## 2016-03-19 NOTE — Assessment & Plan Note (Signed)
Found on ultrasound.  Recommended f/u aortic ultrasound in 5 years.  (due 2022).

## 2016-03-19 NOTE — Assessment & Plan Note (Signed)
Doing well on citalopram.  Follow.   

## 2016-03-19 NOTE — Assessment & Plan Note (Signed)
Blood pressure remaining slightly elevated.  Start lisinopril 10mg  q day.  Follow pressures.  Follow metabolic panel.

## 2016-03-19 NOTE — Assessment & Plan Note (Signed)
On crestor.  Tolerating.  Follow lipid panel and liver function tests.  Low cholesterol diet and exercise.

## 2016-03-22 ENCOUNTER — Ambulatory Visit
Admission: RE | Admit: 2016-03-22 | Discharge: 2016-03-22 | Disposition: A | Discharge status: Home / Self Care | Payer: BLUE CROSS/BLUE SHIELD | Source: Ambulatory Visit | Attending: Internal Medicine | Admitting: Internal Medicine

## 2016-03-22 ENCOUNTER — Other Ambulatory Visit: Payer: Self-pay | Admitting: Internal Medicine

## 2016-03-22 DIAGNOSIS — Z1231 Encounter for screening mammogram for malignant neoplasm of breast: Secondary | ICD-10-CM | POA: Insufficient documentation

## 2016-03-22 DIAGNOSIS — Z1239 Encounter for other screening for malignant neoplasm of breast: Secondary | ICD-10-CM

## 2016-03-22 DIAGNOSIS — R928 Other abnormal and inconclusive findings on diagnostic imaging of breast: Secondary | ICD-10-CM

## 2016-03-23 ENCOUNTER — Other Ambulatory Visit: Payer: Self-pay | Admitting: Internal Medicine

## 2016-03-23 DIAGNOSIS — R928 Other abnormal and inconclusive findings on diagnostic imaging of breast: Secondary | ICD-10-CM

## 2016-03-23 NOTE — Progress Notes (Signed)
Order placed for right breast mammogram and ultrasound.  

## 2016-03-29 ENCOUNTER — Other Ambulatory Visit (INDEPENDENT_AMBULATORY_CARE_PROVIDER_SITE_OTHER): Payer: BLUE CROSS/BLUE SHIELD

## 2016-03-29 ENCOUNTER — Other Ambulatory Visit: Payer: BLUE CROSS/BLUE SHIELD

## 2016-03-29 DIAGNOSIS — I10 Essential (primary) hypertension: Secondary | ICD-10-CM

## 2016-03-29 LAB — BASIC METABOLIC PANEL
BUN: 14 mg/dL (ref 6–23)
CO2: 31 mEq/L (ref 19–32)
Calcium: 9.4 mg/dL (ref 8.4–10.5)
Chloride: 104 mEq/L (ref 96–112)
Creatinine, Ser: 0.71 mg/dL (ref 0.40–1.20)
GFR: 90.32 mL/min (ref >60.00)
Glucose, Bld: 100 mg/dL — ABNORMAL HIGH (ref 70–99)
Potassium: 3.9 mEq/L (ref 3.5–5.1)
Sodium: 141 mEq/L (ref 135–145)

## 2016-04-27 ENCOUNTER — Ambulatory Visit: Payer: BLUE CROSS/BLUE SHIELD

## 2016-04-27 ENCOUNTER — Other Ambulatory Visit: Payer: BLUE CROSS/BLUE SHIELD

## 2016-05-10 ENCOUNTER — Encounter: Payer: Self-pay | Admitting: Internal Medicine

## 2016-05-10 ENCOUNTER — Ambulatory Visit (INDEPENDENT_AMBULATORY_CARE_PROVIDER_SITE_OTHER): Payer: BLUE CROSS/BLUE SHIELD | Admitting: Internal Medicine

## 2016-05-10 VITALS — BP 110/68 | HR 68 | Temp 97.5°F | Ht 64.17 in | Wt 147.2 lb

## 2016-05-10 DIAGNOSIS — R928 Other abnormal and inconclusive findings on diagnostic imaging of breast: Secondary | ICD-10-CM

## 2016-05-10 DIAGNOSIS — I77811 Abdominal aortic ectasia: Secondary | ICD-10-CM

## 2016-05-10 DIAGNOSIS — E7800 Pure hypercholesterolemia, unspecified: Secondary | ICD-10-CM | POA: Diagnosis not present

## 2016-05-10 DIAGNOSIS — I10 Essential (primary) hypertension: Secondary | ICD-10-CM

## 2016-05-10 DIAGNOSIS — F439 Reaction to severe stress, unspecified: Secondary | ICD-10-CM | POA: Diagnosis not present

## 2016-05-10 NOTE — Progress Notes (Signed)
Patient ID: Tammy Daniels, female   DOB: 10-06-1960, 56 y.o.   MRN: [id number]   Subjective:    Patient ID: Tammy Daniels, female    DOB: [birth year], 56 y.o.   MRN: [id number]  HPI  Patient here for a scheduled follow up.  She feels good.  Staying active.  No chest pain.  No sob.  No acid reflux.  No abdominal pain or cramping.  No bowel change.  Taking lisinopril.  Blood pressures doing better.  Tolerating the medication.     Past Medical History:  Diagnosis Date  . Allergic rhinitis   . Elevated blood pressure   . Hypercholesterolemia    Past Surgical History:  Procedure Laterality Date  . DILATION AND CURETTAGE, DIAGNOSTIC / THERAPEUTIC  1992  . explaratory lap  1992  . HYSTEROSCOPY  1997   with cervical polyp removal   Family History  Problem Relation Age of Onset  . Hyperlipidemia Mother   . Hypertension Mother   . Heart disease Father     died 82 MI  . Hypertension Brother   . Hypothyroidism Sister    Social History   Social History  . Marital status: Married    Spouse name: N/A  . Number of children: 3  . Years of education: N/A   Occupational History  .  [employer]   Social History Main Topics  . Smoking status: Never Smoker  . Smokeless tobacco: Never Used  . Alcohol use 0.0 oz/week  . Drug use: No  . Sexual activity: Not Asked   Other Topics Concern  . None   Social History Narrative  . None    Outpatient Encounter Prescriptions as of 05/10/2016  Medication Sig  . CALCIUM PO Take by mouth.  . citalopram (CELEXA) 10 MG tablet Take 1 tablet (10 mg total) by mouth daily.  Marland Kitchen lisinopril (PRINIVIL,ZESTRIL) 10 MG tablet Take 1 tablet (10 mg total) by mouth daily.  . rosuvastatin (CRESTOR) 5 MG tablet TAKE ONE TABLET BY MOUTH ONCE DAILY   No facility-administered encounter medications on file as of 05/10/2016.     Review of Systems  Constitutional: Negative for appetite change and unexpected weight change.  HENT: Negative for congestion and  sinus pressure.   Respiratory: Negative for cough, chest tightness and shortness of breath.   Cardiovascular: Negative for chest pain, palpitations and leg swelling.  Gastrointestinal: Negative for abdominal pain, diarrhea, nausea and vomiting.  Genitourinary: Negative for difficulty urinating and dysuria.  Musculoskeletal: Negative for back pain and joint swelling.  Skin: Negative for color change and rash.  Neurological: Negative for dizziness, light-headedness and headaches.  Psychiatric/Behavioral: Negative for agitation and dysphoric mood.       Objective:    Physical Exam  Constitutional: She appears well-developed and well-nourished. No distress.  HENT:  Nose: Nose normal.  Mouth/Throat: Oropharynx is clear and moist.  Neck: Neck supple. No thyromegaly present.  Cardiovascular: Normal rate and regular rhythm.   Pulmonary/Chest: Breath sounds normal. No respiratory distress. She has no wheezes.  Abdominal: Soft. Bowel sounds are normal. There is no tenderness.  Musculoskeletal: She exhibits no edema or tenderness.  Lymphadenopathy:    She has no cervical adenopathy.  Skin: No rash noted. No erythema.  Psychiatric: She has a normal mood and affect. Her behavior is normal.    BP 110/68   Pulse 68   Temp 97.5 F (36.4 C) (Oral)   Ht 5' 4.17" (1.63 m)   Wt 147 lb 3.2  oz (66.8 kg)   SpO2 96%   BMI 25.13 kg/m  Wt Readings from Last 3 Encounters:  05/10/16 147 lb 3.2 oz (66.8 kg)  03/15/16 148 lb 3.2 oz (67.2 kg)  12/16/15 146 lb 6 oz (66.4 kg)     Lab Results  Component Value Date   WBC 8.2 06/16/2015   HGB 15.2 (H) 06/16/2015   HCT 44.2 06/16/2015   PLT 242.0 06/16/2015   GLUCOSE 100 (H) 03/29/2016   CHOL 196 12/29/2015   TRIG 125.0 12/29/2015   HDL 58.70 12/29/2015   LDLCALC 112 (H) 12/29/2015   ALT 20 12/29/2015   AST 18 12/29/2015   NA 141 03/29/2016   K 3.9 03/29/2016   CL 104 03/29/2016   CREATININE 0.71 03/29/2016   BUN 14 03/29/2016   CO2 31  03/29/2016   TSH 2.47 12/02/2014    Mm Digital Screening  Result Date: 03/22/2016 CLINICAL DATA:  Screening. EXAM: DIGITAL SCREENING BILATERAL MAMMOGRAM WITH CAD COMPARISON:  Previous exam(s). ACR Breast Density Category c: The breast tissue is heterogeneously dense, which may obscure small masses. FINDINGS: In the right breast, possible distortion warrants further evaluation. In the left breast, no findings suspicious for malignancy. Images were processed with CAD. IMPRESSION: Further evaluation is suggested for possible distortion in the right breast. RECOMMENDATION: Diagnostic mammogram and possibly ultrasound of the right breast. (Code:[id number]) The patient will be contacted regarding the findings, and additional imaging will be scheduled. BI-RADS CATEGORY  0: Incomplete. Need additional imaging evaluation and/or prior mammograms for comparison. Electronically Signed   By: Rolla Plateandolph  Jackson M.D.   On: 03/22/2016 14:10       Assessment & Plan:   Problem List Items Addressed This Visit    Abdominal aortic ectasia (HCC)    Found on ultrasound.  Recommended f/u aortic ultrasound in 5 years.  Due 2022.        Essential hypertension    Blood pressure under good control.  Continue same medication regimen.  Follow pressures.  Follow metabolic panel.        Relevant Orders   CBC with Differential/Platelet   TSH   Basic metabolic panel   Hypercholesterolemia    On crestor.  Follow lipid panel and liver function tests.   Lab Results  Component Value Date   CHOL 196 12/29/2015   HDL 58.70 12/29/2015   LDLCALC 112 (H) 12/29/2015   TRIG 125.0 12/29/2015   CHOLHDL 3 12/29/2015        Relevant Orders   Hepatic function panel   Lipid panel   Stress    On citalopram and doing well.  Follow.         Other Visit Diagnoses    Abnormal mammogram    -  Primary   mammogram 03/22/16 - Birads 0.  recommended f/u right breast mammogram.  scheduled.         Dale DurhamSCOTT, Vernette Moise, MD

## 2016-05-10 NOTE — Progress Notes (Signed)
Pre visit review using our clinic review tool, if applicable. No additional management support is needed unless otherwise documented below in the visit note. 

## 2016-05-11 ENCOUNTER — Other Ambulatory Visit: Payer: BLUE CROSS/BLUE SHIELD

## 2016-05-12 ENCOUNTER — Encounter: Payer: Self-pay | Admitting: Internal Medicine

## 2016-05-12 NOTE — Assessment & Plan Note (Signed)
Found on ultrasound.  Recommended f/u aortic ultrasound in 5 years.  Due 2022.

## 2016-05-12 NOTE — Assessment & Plan Note (Signed)
On crestor.  Follow lipid panel and liver function tests.   Lab Results  Component Value Date   CHOL 196 12/29/2015   HDL 58.70 12/29/2015   LDLCALC 112 (H) 12/29/2015   TRIG 125.0 12/29/2015   CHOLHDL 3 12/29/2015

## 2016-05-12 NOTE — Assessment & Plan Note (Signed)
On citalopram and doing well.  Follow.   

## 2016-05-12 NOTE — Assessment & Plan Note (Signed)
Blood pressure under good control.  Continue same medication regimen.  Follow pressures.  Follow metabolic panel.   

## 2016-05-22 ENCOUNTER — Other Ambulatory Visit: Payer: Self-pay | Admitting: Internal Medicine

## 2016-05-24 ENCOUNTER — Ambulatory Visit
Admission: RE | Admit: 2016-05-24 | Discharge: 2016-05-24 | Disposition: A | Discharge status: Home / Self Care | Payer: BLUE CROSS/BLUE SHIELD | Source: Ambulatory Visit | Attending: Internal Medicine | Admitting: Internal Medicine

## 2016-05-24 DIAGNOSIS — R928 Other abnormal and inconclusive findings on diagnostic imaging of breast: Secondary | ICD-10-CM | POA: Diagnosis not present

## 2016-05-24 DIAGNOSIS — N6489 Other specified disorders of breast: Secondary | ICD-10-CM | POA: Diagnosis not present

## 2016-06-03 ENCOUNTER — Telehealth: Payer: Self-pay | Admitting: Radiology

## 2016-06-03 NOTE — Telephone Encounter (Signed)
Pt Tammy Daniels from 05/24/16 has not been reviewed by provider and results have not been called.

## 2016-06-14 ENCOUNTER — Other Ambulatory Visit: Payer: Self-pay | Admitting: Internal Medicine

## 2016-06-14 NOTE — Telephone Encounter (Signed)
Refilled 12/16/2015. Last office visit 03/15/2016. Follow up appt scheduled for 08/16/2016. It doesn't look like the pt has been taking this regularly so wasn't sure if okay to refill.

## 2016-06-15 NOTE — Telephone Encounter (Signed)
ok'd rx for citalopram #90 with no refills.

## 2016-08-09 ENCOUNTER — Other Ambulatory Visit: Payer: BLUE CROSS/BLUE SHIELD

## 2016-08-13 ENCOUNTER — Other Ambulatory Visit: Payer: Self-pay | Admitting: Internal Medicine

## 2016-08-16 ENCOUNTER — Ambulatory Visit: Payer: BLUE CROSS/BLUE SHIELD | Admitting: Internal Medicine

## 2016-10-18 ENCOUNTER — Other Ambulatory Visit: Payer: Self-pay | Admitting: Internal Medicine

## 2016-11-05 ENCOUNTER — Other Ambulatory Visit: Payer: Self-pay | Admitting: Internal Medicine

## 2016-11-22 ENCOUNTER — Other Ambulatory Visit: Payer: Self-pay | Admitting: Internal Medicine

## 2016-11-23 ENCOUNTER — Other Ambulatory Visit (INDEPENDENT_AMBULATORY_CARE_PROVIDER_SITE_OTHER): Payer: BLUE CROSS/BLUE SHIELD

## 2016-11-23 DIAGNOSIS — E7800 Pure hypercholesterolemia, unspecified: Secondary | ICD-10-CM

## 2016-11-23 DIAGNOSIS — I10 Essential (primary) hypertension: Secondary | ICD-10-CM | POA: Diagnosis not present

## 2016-11-23 LAB — CBC WITH DIFFERENTIAL/PLATELET
Basophils Absolute: 0 10*3/uL (ref 0.0–0.1)
Basophils Relative: 0.4 % (ref 0.0–3.0)
Eosinophils Absolute: 0.5 10*3/uL (ref 0.0–0.7)
Eosinophils Relative: 6.6 % — ABNORMAL HIGH (ref 0.0–5.0)
HCT: 45 % (ref 36.0–46.0)
Hemoglobin: 14.7 g/dL (ref 12.0–15.0)
Lymphocytes Relative: 35.9 % (ref 12.0–46.0)
Lymphs Abs: 2.8 10*3/uL (ref 0.7–4.0)
MCHC: 32.6 g/dL (ref 30.0–36.0)
MCV: 90.9 fl (ref 78.0–100.0)
Monocytes Absolute: 0.5 10*3/uL (ref 0.1–1.0)
Monocytes Relative: 6.9 % (ref 3.0–12.0)
Neutro Abs: 3.9 10*3/uL (ref 1.4–7.7)
Neutrophils Relative %: 50.2 % (ref 43.0–77.0)
Platelets: 257 10*3/uL (ref 150.0–400.0)
RBC: 4.94 Mil/uL (ref 3.87–5.11)
RDW: 13.3 % (ref 11.5–15.5)
WBC: 7.8 10*3/uL (ref 4.0–10.5)

## 2016-11-23 LAB — HEPATIC FUNCTION PANEL
ALT: 24 U/L (ref 0–35)
AST: 20 U/L (ref 0–37)
Albumin: 4.2 g/dL (ref 3.5–5.2)
Alkaline Phosphatase: 84 U/L (ref 39–117)
Bilirubin, Direct: 0.1 mg/dL (ref 0.0–0.3)
Total Bilirubin: 0.5 mg/dL (ref 0.2–1.2)
Total Protein: 7 g/dL (ref 6.0–8.3)

## 2016-11-23 LAB — BASIC METABOLIC PANEL
BUN: 16 mg/dL (ref 6–23)
CO2: 29 mEq/L (ref 19–32)
Calcium: 9.2 mg/dL (ref 8.4–10.5)
Chloride: 106 mEq/L (ref 96–112)
Creatinine, Ser: 0.77 mg/dL (ref 0.40–1.20)
GFR: 82.05 mL/min (ref >60.00)
Glucose, Bld: 95 mg/dL (ref 70–99)
Potassium: 4.1 mEq/L (ref 3.5–5.1)
Sodium: 141 mEq/L (ref 135–145)

## 2016-11-23 LAB — LIPID PANEL
Cholesterol: 195 mg/dL (ref 0–200)
HDL: 55.3 mg/dL (ref >39.00)
LDL Cholesterol: 117 mg/dL — ABNORMAL HIGH (ref 0–99)
NonHDL: 139.77
Total CHOL/HDL Ratio: 4
Triglycerides: 116 mg/dL (ref 0.0–149.0)
VLDL: 23.2 mg/dL (ref 0.0–40.0)

## 2016-11-23 LAB — TSH: TSH: 3.54 u[IU]/mL (ref 0.35–4.50)

## 2016-11-29 ENCOUNTER — Ambulatory Visit (INDEPENDENT_AMBULATORY_CARE_PROVIDER_SITE_OTHER): Payer: BLUE CROSS/BLUE SHIELD | Admitting: Internal Medicine

## 2016-11-29 ENCOUNTER — Encounter: Payer: Self-pay | Admitting: Internal Medicine

## 2016-11-29 VITALS — BP 120/72 | HR 71 | Temp 98.6°F | Resp 12 | Ht 64.0 in | Wt 149.4 lb

## 2016-11-29 DIAGNOSIS — E7800 Pure hypercholesterolemia, unspecified: Secondary | ICD-10-CM

## 2016-11-29 DIAGNOSIS — E559 Vitamin D deficiency, unspecified: Secondary | ICD-10-CM

## 2016-11-29 DIAGNOSIS — I77811 Abdominal aortic ectasia: Secondary | ICD-10-CM | POA: Diagnosis not present

## 2016-11-29 DIAGNOSIS — I10 Essential (primary) hypertension: Secondary | ICD-10-CM

## 2016-11-29 DIAGNOSIS — F439 Reaction to severe stress, unspecified: Secondary | ICD-10-CM

## 2016-11-29 DIAGNOSIS — R197 Diarrhea, unspecified: Secondary | ICD-10-CM | POA: Diagnosis not present

## 2016-11-29 MED ORDER — LISINOPRIL 10 MG PO TABS: 10.0000 mg | ORAL_TABLET | Freq: Every day | ORAL | 1 refills | Status: DC

## 2016-11-29 NOTE — Assessment & Plan Note (Signed)
Follow vitamin D level.  

## 2016-11-29 NOTE — Assessment & Plan Note (Signed)
On crestor.  Reviewed recent labs.  Triglycerides improved.  Low cholesterol diet and exercise.  Follow lipid panel and liver function tests.

## 2016-11-29 NOTE — Progress Notes (Signed)
Patient ID: Tammy Daniels, female   DOB: 02/11/1961, 56 y.o.   MRN: [id number]   Subjective:    Patient ID: Tammy Deedsllen H Packard, female    DOB: 11/10/1960, 56 y.o.   MRN: [id number]  HPI  Patient here for a scheduled follow up.  She reports she is doing well.  Feels good.  Stays active.  Just returned from a trip to the beach.  Went to [place] in the spring.  No chest pain.  No sob.  No acid reflux.  No abdominal pain.  Some diarrhea this am.  Several episodes.  None since 9:00 this am.  She ate a a lot of vegetables last night - corn on the cob, broccoli and slaw.  Was questioning if this could have aggravated.  No abdominal pain.  No nausea or vomiting.  No blood in the stool.  Overall feels good.  Bring in blood pressure readings.  Blood pressures look good.  Averaging 114-120s/70s.     Past Medical History:  Diagnosis Date  . Allergic rhinitis   . Elevated blood pressure   . Hypercholesterolemia    Past Surgical History:  Procedure Laterality Date  . DILATION AND CURETTAGE, DIAGNOSTIC / THERAPEUTIC  1992  . explaratory lap  1992  . HYSTEROSCOPY  1997   with cervical polyp removal   Family History  Problem Relation Age of Onset  . Hyperlipidemia Mother   . Hypertension Mother   . Heart disease Father        died 6958 MI  . Hypertension Brother   . Hypothyroidism Sister    Social History   Social History  . Marital status: Married    Spouse name: N/A  . Number of children: 3  . Years of education: N/A   Occupational History  .  [employer]   Social History Main Topics  . Smoking status: Never Smoker  . Smokeless tobacco: Never Used  . Alcohol use 0.0 oz/week  . Drug use: No  . Sexual activity: Not Asked   Other Topics Concern  . None   Social History Narrative  . None    Outpatient Encounter Prescriptions as of 11/29/2016  Medication Sig  . CALCIUM PO Take by mouth.  . citalopram (CELEXA) 10 MG tablet TAKE ONE TABLET BY MOUTH ONCE DAILY  . lisinopril  (PRINIVIL,ZESTRIL) 10 MG tablet Take 1 tablet (10 mg total) by mouth daily.  . rosuvastatin (CRESTOR) 5 MG tablet TAKE ONE TABLET BY MOUTH ONCE DAILY (Patient taking differently: TAKE ONE TABLET BY MOUTH ONCE DAILY takes 3 x week only)  . [DISCONTINUED] lisinopril (PRINIVIL,ZESTRIL) 10 MG tablet TAKE ONE TABLET BY MOUTH ONCE DAILY  . [DISCONTINUED] citalopram (CELEXA) 10 MG tablet TAKE ONE TABLET BY MOUTH ONCE DAILY  . [DISCONTINUED] lisinopril (PRINIVIL,ZESTRIL) 10 MG tablet TAKE ONE TABLET BY MOUTH ONCE DAILY   No facility-administered encounter medications on file as of 11/29/2016.     Review of Systems  Constitutional: Negative for appetite change and unexpected weight change.  HENT: Negative for congestion and sinus pressure.   Respiratory: Negative for cough, chest tightness and shortness of breath.   Cardiovascular: Negative for chest pain, palpitations and leg swelling.  Gastrointestinal: Positive for diarrhea. Negative for abdominal pain, nausea and vomiting.  Genitourinary: Negative for difficulty urinating and dysuria.  Musculoskeletal: Negative for back pain and joint swelling.  Skin: Negative for color change and rash.  Neurological: Negative for dizziness, light-headedness and headaches.  Psychiatric/Behavioral: Negative for agitation and dysphoric mood.  Objective:    Physical Exam  Constitutional: She appears well-developed and well-nourished. No distress.  HENT:  Nose: Nose normal.  Mouth/Throat: Oropharynx is clear and moist.  Neck: Neck supple. No thyromegaly present.  Cardiovascular: Normal rate and regular rhythm.   Pulmonary/Chest: Breath sounds normal. No respiratory distress. She has no wheezes.  Abdominal: Soft. Bowel sounds are normal. There is no tenderness.  Musculoskeletal: She exhibits no edema or tenderness.  Lymphadenopathy:    She has no cervical adenopathy.  Skin: No rash noted. No erythema.  Psychiatric: She has a normal mood and affect. Her  behavior is normal.    BP 120/72 (BP Location: Left Arm, Patient Position: Sitting, Cuff Size: Normal)   Pulse 71   Temp 98.6 F (37 C) (Oral)   Resp 12   Ht 5\' 4"  (1.626 m)   Wt 149 lb 6.4 oz (67.8 kg)   SpO2 97%   BMI 25.64 kg/m  Wt Readings from Last 3 Encounters:  11/29/16 149 lb 6.4 oz (67.8 kg)  05/10/16 147 lb 3.2 oz (66.8 kg)  03/15/16 148 lb 3.2 oz (67.2 kg)     Lab Results  Component Value Date   WBC 7.8 11/23/2016   HGB 14.7 11/23/2016   HCT 45.0 11/23/2016   PLT 257.0 11/23/2016   GLUCOSE 95 11/23/2016   CHOL 195 11/23/2016   TRIG 116.0 11/23/2016   HDL 55.30 11/23/2016   LDLCALC 117 (H) 11/23/2016   ALT 24 11/23/2016   AST 20 11/23/2016   NA 141 11/23/2016   K 4.1 11/23/2016   CL 106 11/23/2016   CREATININE 0.77 11/23/2016   BUN 16 11/23/2016   CO2 29 11/23/2016   TSH 3.54 11/23/2016    US Breast Ltd Uni Right Inc Axilla  Result Date: 05/24/2016 CLINICAL DATA:  Screening recall for possible right breast distortion. EXAM: 2D DIGITAL DIAGNOSTIC UNILATERAL RIGHT MAMMOGRAM WITH CAD AND ADJUNCT TOMO RIGHT BREAST ULTRASOUND COMPARISON:  Previous exam(s). ACR Breast Density Category c: The breast tissue is heterogeneously dense, which may obscure small masses. FINDINGS: CC, MLO and ML tomograms as well as spot compression CC tomograms were performed of the right breast. Initially questioned possible right breast distortion resolves on the additional imaging with findings compatible with overlapping fibroglandular tissue although heterogeneous tissue in this location could obscure a small mass. Mammographic images were processed with CAD. Physical examination of the inner right breast does not reveal any palpable masses. Targeted ultrasound of the right breast was performed. No discrete masses or abnormalities are seen, only heterogeneous fibroglandular tissue is visualized. IMPRESSION: No findings of malignancy in the right breast. RECOMMENDATION: Screening mammogram  in one year.(Code:SM-B-01Y) I have discussed the findings and recommendations with the patient. Results were also provided in writing at the conclusion of the visit. If applicable, a reminder letter will be sent to the patient regarding the next appointment. BI-RADS CATEGORY  1: Negative. Electronically Signed   By: Edwin Cap M.D.   On: 05/24/2016 12:07   Mm Diag Breast Tomo Uni Right  Result Date: 05/24/2016 CLINICAL DATA:  Screening recall for possible right breast distortion. EXAM: 2D DIGITAL DIAGNOSTIC UNILATERAL RIGHT MAMMOGRAM WITH CAD AND ADJUNCT TOMO RIGHT BREAST ULTRASOUND COMPARISON:  Previous exam(s). ACR Breast Density Category c: The breast tissue is heterogeneously dense, which may obscure small masses. FINDINGS: CC, MLO and ML tomograms as well as spot compression CC tomograms were performed of the right breast. Initially questioned possible right breast distortion resolves on the additional imaging with  findings compatible with overlapping fibroglandular tissue although heterogeneous tissue in this location could obscure a small mass. Mammographic images were processed with CAD. Physical examination of the inner right breast does not reveal any palpable masses. Targeted ultrasound of the right breast was performed. No discrete masses or abnormalities are seen, only heterogeneous fibroglandular tissue is visualized. IMPRESSION: No findings of malignancy in the right breast. RECOMMENDATION: Screening mammogram in one year.(Code:SM-B-01Y) I have discussed the findings and recommendations with the patient. Results were also provided in writing at the conclusion of the visit. If applicable, a reminder letter will be sent to the patient regarding the next appointment. BI-RADS CATEGORY  1: Negative. Electronically Signed   By: Edwin Cap M.D.   On: 05/24/2016 12:07       Assessment & Plan:   Problem List Items Addressed This Visit    Abdominal aortic ectasia (HCC)    Found on  ultrasound.  Recommended f/u in 5 years.  Due 2022.        Relevant Medications   lisinopril (PRINIVIL,ZESTRIL) 10 MG tablet   Diarrhea - Primary    Had the episode this am as outlined.  Bland foods.  Notify me if persistent.        Essential hypertension    Blood pressure under good control.  Continue same medication regimen.  Follow pressures.  Follow metabolic panel.        Relevant Medications   lisinopril (PRINIVIL,ZESTRIL) 10 MG tablet   Other Relevant Orders   Basic metabolic panel   Hypercholesterolemia    On crestor.  Reviewed recent labs.  Triglycerides improved.  Low cholesterol diet and exercise.  Follow lipid panel and liver function tests.        Relevant Medications   lisinopril (PRINIVIL,ZESTRIL) 10 MG tablet   Other Relevant Orders   Hepatic function panel   Lipid panel   Stress    Doing well on citalopram.        Vitamin D deficiency    Follow vitamin D level.           Dale [place], MD

## 2016-11-29 NOTE — Progress Notes (Signed)
Pre-visit discussion using our clinic review tool. No additional management support is needed unless otherwise documented below in the visit note.  

## 2016-11-29 NOTE — Assessment & Plan Note (Signed)
Blood pressure under good control.  Continue same medication regimen.  Follow pressures.  Follow metabolic panel.   

## 2016-11-29 NOTE — Assessment & Plan Note (Signed)
Had the episode this am as outlined.  Bland foods.  Notify me if persistent.

## 2016-11-29 NOTE — Assessment & Plan Note (Signed)
Doing well on citalopram.

## 2016-11-29 NOTE — Assessment & Plan Note (Signed)
Found on ultrasound.  Recommended f/u in 5 years.  Due 2022.   

## 2017-02-14 ENCOUNTER — Other Ambulatory Visit: Payer: Self-pay | Admitting: Internal Medicine

## 2017-03-29 ENCOUNTER — Other Ambulatory Visit: Payer: BLUE CROSS/BLUE SHIELD

## 2017-04-05 ENCOUNTER — Encounter: Payer: BLUE CROSS/BLUE SHIELD | Admitting: Internal Medicine

## 2017-04-11 ENCOUNTER — Other Ambulatory Visit: Payer: Self-pay | Admitting: Internal Medicine

## 2017-06-27 ENCOUNTER — Other Ambulatory Visit (INDEPENDENT_AMBULATORY_CARE_PROVIDER_SITE_OTHER): Payer: BLUE CROSS/BLUE SHIELD

## 2017-06-27 DIAGNOSIS — I10 Essential (primary) hypertension: Secondary | ICD-10-CM | POA: Diagnosis not present

## 2017-06-27 DIAGNOSIS — E7800 Pure hypercholesterolemia, unspecified: Secondary | ICD-10-CM

## 2017-06-27 LAB — BASIC METABOLIC PANEL
BUN: 14 mg/dL (ref 6–23)
CO2: 31 mEq/L (ref 19–32)
Calcium: 9.7 mg/dL (ref 8.4–10.5)
Chloride: 105 mEq/L (ref 96–112)
Creatinine, Ser: 0.68 mg/dL (ref 0.40–1.20)
GFR: 94.51 mL/min (ref >60.00)
Glucose, Bld: 96 mg/dL (ref 70–99)
Potassium: 4.2 mEq/L (ref 3.5–5.1)
Sodium: 140 mEq/L (ref 135–145)

## 2017-06-27 LAB — HEPATIC FUNCTION PANEL
ALT: 17 U/L (ref 0–35)
AST: 17 U/L (ref 0–37)
Albumin: 4.2 g/dL (ref 3.5–5.2)
Alkaline Phosphatase: 80 U/L (ref 39–117)
Bilirubin, Direct: 0.1 mg/dL (ref 0.0–0.3)
Total Bilirubin: 0.6 mg/dL (ref 0.2–1.2)
Total Protein: 7.2 g/dL (ref 6.0–8.3)

## 2017-06-27 LAB — LIPID PANEL
Cholesterol: 226 mg/dL — ABNORMAL HIGH (ref 0–200)
HDL: 57.9 mg/dL (ref >39.00)
NonHDL: 168.27
Total CHOL/HDL Ratio: 4
Triglycerides: 215 mg/dL — ABNORMAL HIGH (ref 0.0–149.0)
VLDL: 43 mg/dL — ABNORMAL HIGH (ref 0.0–40.0)

## 2017-06-27 LAB — LDL CHOLESTEROL, DIRECT: Direct LDL: 150 mg/dL

## 2017-07-04 ENCOUNTER — Ambulatory Visit (INDEPENDENT_AMBULATORY_CARE_PROVIDER_SITE_OTHER): Payer: BLUE CROSS/BLUE SHIELD | Admitting: Internal Medicine

## 2017-07-04 ENCOUNTER — Encounter: Payer: Self-pay | Admitting: Internal Medicine

## 2017-07-04 ENCOUNTER — Other Ambulatory Visit (HOSPITAL_COMMUNITY)
Admission: RE | Admit: 2017-07-04 | Discharge: 2017-07-04 | Disposition: A | Discharge status: Home / Self Care | Payer: BLUE CROSS/BLUE SHIELD | Source: Ambulatory Visit | Attending: Internal Medicine | Admitting: Internal Medicine

## 2017-07-04 VITALS — BP 122/78 | HR 67 | Temp 98.0°F | Resp 16 | Wt 154.6 lb

## 2017-07-04 DIAGNOSIS — F439 Reaction to severe stress, unspecified: Secondary | ICD-10-CM

## 2017-07-04 DIAGNOSIS — I10 Essential (primary) hypertension: Secondary | ICD-10-CM | POA: Diagnosis not present

## 2017-07-04 DIAGNOSIS — E7800 Pure hypercholesterolemia, unspecified: Secondary | ICD-10-CM

## 2017-07-04 DIAGNOSIS — Z1231 Encounter for screening mammogram for malignant neoplasm of breast: Secondary | ICD-10-CM

## 2017-07-04 DIAGNOSIS — Z124 Encounter for screening for malignant neoplasm of cervix: Secondary | ICD-10-CM

## 2017-07-04 DIAGNOSIS — E559 Vitamin D deficiency, unspecified: Secondary | ICD-10-CM

## 2017-07-04 DIAGNOSIS — Z0000 Encounter for general adult medical examination without abnormal findings: Secondary | ICD-10-CM | POA: Diagnosis not present

## 2017-07-04 DIAGNOSIS — R0981 Nasal congestion: Secondary | ICD-10-CM

## 2017-07-04 DIAGNOSIS — I77811 Abdominal aortic ectasia: Secondary | ICD-10-CM | POA: Diagnosis not present

## 2017-07-04 DIAGNOSIS — Z1239 Encounter for other screening for malignant neoplasm of breast: Secondary | ICD-10-CM

## 2017-07-04 MED ORDER — LISINOPRIL 10 MG PO TABS: 10.0000 mg | ORAL_TABLET | Freq: Every day | ORAL | 3 refills | Status: DC

## 2017-07-04 MED ORDER — CITALOPRAM HYDROBROMIDE 10 MG PO TABS: 10.0000 mg | ORAL_TABLET | Freq: Every day | ORAL | 3 refills | Status: DC

## 2017-07-04 NOTE — Assessment & Plan Note (Addendum)
Physical today 07/04/17.  PAP 07/04/17.  Mammogram 03/22/17 - Birads 0.  F/u right breast mammogram 05/24/17 - Briads I.  Colonoscopy 06/2013 - normal.

## 2017-07-04 NOTE — Progress Notes (Signed)
Patient ID: Tammy Daniels, female   DOB: 1961/02/17, 57 y.o.   MRN: [id number]   Subjective:    Patient ID: Tammy Daniels, female    DOB: Dec 23, 1960, 57 y.o.   MRN: [id number]  HPI  Patient here for her physical exam.  She reports she is doing relatively well.  Not exercising as much.  Is trying to watch her diet.  Discussed diet and exercise.  No chest pain.  No sob.  No acid reflux.  No abdominal pain.  Bowels moving.  No urine change reported.  Blood pressures averaging on outside checks:  120- low 130s/70-low 80s.  Some allergy symptoms with nasal congestion and drainage.  No chest congestion or significant cough currently.     Past Medical History:  Diagnosis Date  . Allergic rhinitis   . Elevated blood pressure   . Hypercholesterolemia    Past Surgical History:  Procedure Laterality Date  . DILATION AND CURETTAGE, DIAGNOSTIC / THERAPEUTIC  1992  . explaratory lap  1992  . HYSTEROSCOPY  1997   with cervical polyp removal   Family History  Problem Relation Age of Onset  . Hyperlipidemia Mother   . Hypertension Mother   . Heart disease Father        died 18 MI  . Hypertension Brother   . Hypothyroidism Sister    Social History   Socioeconomic History  . Marital status: Married    Spouse name: None  . Number of children: 3  . Years of education: None  . Highest education level: None  Social Needs  . Financial resource strain: None  . Food insecurity - worry: None  . Food insecurity - inability: None  . Transportation needs - medical: None  . Transportation needs - non-medical: None  Occupational History    Employer: [employer]  Tobacco Use  . Smoking status: Never Smoker  . Smokeless tobacco: Never Used  Substance and Sexual Activity  . Alcohol use: Yes    Alcohol/week: 0.0 oz  . Drug use: No  . Sexual activity: None  Other Topics Concern  . None  Social History Narrative  . None    Outpatient Encounter Medications as of 07/04/2017  Medication Sig    . CALCIUM PO Take by mouth.  . citalopram (CELEXA) 10 MG tablet Take 1 tablet (10 mg total) by mouth daily.  Marland Kitchen lisinopril (PRINIVIL,ZESTRIL) 10 MG tablet Take 1 tablet (10 mg total) by mouth daily.  . rosuvastatin (CRESTOR) 5 MG tablet TAKE ONE TABLET BY MOUTH ONCE DAILY (Patient taking differently: TAKE ONE TABLET BY MOUTH ONCE DAILY takes 3 x week only)  . [DISCONTINUED] citalopram (CELEXA) 10 MG tablet TAKE ONE TABLET BY MOUTH ONCE DAILY  . [DISCONTINUED] lisinopril (PRINIVIL,ZESTRIL) 10 MG tablet TAKE ONE TABLET BY MOUTH ONCE DAILY  . [DISCONTINUED] citalopram (CELEXA) 10 MG tablet TAKE 1 TABLET BY MOUTH ONCE DAILY  . [DISCONTINUED] lisinopril (PRINIVIL,ZESTRIL) 10 MG tablet Take 1 tablet (10 mg total) by mouth daily.   No facility-administered encounter medications on file as of 07/04/2017.     Review of Systems  Constitutional: Negative for appetite change and unexpected weight change.  HENT: Positive for congestion and postnasal drip. Negative for sinus pressure.   Eyes: Negative for pain and visual disturbance.  Respiratory: Negative for cough, chest tightness and shortness of breath.   Cardiovascular: Negative for chest pain, palpitations and leg swelling.  Gastrointestinal: Negative for abdominal pain, diarrhea, nausea and vomiting.  Genitourinary: Negative for difficulty  urinating and dysuria.  Musculoskeletal: Negative for joint swelling and myalgias.  Skin: Negative for color change and rash.  Neurological: Negative for dizziness, light-headedness and headaches.  Hematological: Negative for adenopathy. Does not bruise/bleed easily.  Psychiatric/Behavioral: Negative for agitation and dysphoric mood.       Objective:     Blood pressure rechecked by me:  130-132/82  Physical Exam  Constitutional: She is oriented to person, place, and time. She appears well-developed and well-nourished. No distress.  HENT:  Nose: Nose normal.  Mouth/Throat: Oropharynx is clear and moist.   Eyes: Right eye exhibits no discharge. Left eye exhibits no discharge. No scleral icterus.  Neck: Neck supple. No thyromegaly present.  Cardiovascular: Normal rate and regular rhythm.  Pulmonary/Chest: Breath sounds normal. No accessory muscle usage. No tachypnea. No respiratory distress. She has no decreased breath sounds. She has no wheezes. She has no rhonchi. Right breast exhibits no inverted nipple, no mass, no nipple discharge and no tenderness (no axillary adenopathy). Left breast exhibits no inverted nipple, no mass, no nipple discharge and no tenderness (no axilarry adenopathy).  Abdominal: Soft. Bowel sounds are normal. There is no tenderness.  Genitourinary:  Genitourinary Comments: Normal external genitalia.  Vaginal vault without lesions.  Cervix identified.  Pap smear performed.  Some atrophy changes noted.  Could not appreciate any adnexal masses or tenderness.    Musculoskeletal: She exhibits no edema or tenderness.  Lymphadenopathy:    She has no cervical adenopathy.  Neurological: She is alert and oriented to person, place, and time.  Skin: Skin is warm. No rash noted. No erythema.  Psychiatric: She has a normal mood and affect. Her behavior is normal.    BP 122/78 (BP Location: Left Arm, Patient Position: Sitting, Cuff Size: Normal)   Pulse 67   Temp 98 F (36.7 C) (Oral)   Resp 16   Wt 154 lb 9.6 oz (70.1 kg)   SpO2 98%   BMI 26.54 kg/m  Wt Readings from Last 3 Encounters:  07/04/17 154 lb 9.6 oz (70.1 kg)  11/29/16 149 lb 6.4 oz (67.8 kg)  05/10/16 147 lb 3.2 oz (66.8 kg)     Lab Results  Component Value Date   WBC 7.8 11/23/2016   HGB 14.7 11/23/2016   HCT 45.0 11/23/2016   PLT 257.0 11/23/2016   GLUCOSE 96 06/27/2017   CHOL 226 (H) 06/27/2017   TRIG 215.0 (H) 06/27/2017   HDL 57.90 06/27/2017   LDLDIRECT 150.0 06/27/2017   LDLCALC 117 (H) 11/23/2016   ALT 17 06/27/2017   AST 17 06/27/2017   NA 140 06/27/2017   K 4.2 06/27/2017   CL 105  06/27/2017   CREATININE 0.68 06/27/2017   BUN 14 06/27/2017   CO2 31 06/27/2017   TSH 3.54 11/23/2016    US Breast Ltd Uni Right Inc Axilla  Result Date: 05/24/2016 CLINICAL DATA:  Screening recall for possible right breast distortion. EXAM: 2D DIGITAL DIAGNOSTIC UNILATERAL RIGHT MAMMOGRAM WITH CAD AND ADJUNCT TOMO RIGHT BREAST ULTRASOUND COMPARISON:  Previous exam(s). ACR Breast Density Category c: The breast tissue is heterogeneously dense, which may obscure small masses. FINDINGS: CC, MLO and ML tomograms as well as spot compression CC tomograms were performed of the right breast. Initially questioned possible right breast distortion resolves on the additional imaging with findings compatible with overlapping fibroglandular tissue although heterogeneous tissue in this location could obscure a small mass. Mammographic images were processed with CAD. Physical examination of the inner right breast does not reveal any  palpable masses. Targeted ultrasound of the right breast was performed. No discrete masses or abnormalities are seen, only heterogeneous fibroglandular tissue is visualized. IMPRESSION: No findings of malignancy in the right breast. RECOMMENDATION: Screening mammogram in one year.(Code:SM-B-01Y) I have discussed the findings and recommendations with the patient. Results were also provided in writing at the conclusion of the visit. If applicable, a reminder letter will be sent to the patient regarding the next appointment. BI-RADS CATEGORY  1: Negative. Electronically Signed   By: Edwin CapJennifer  Jarosz M.D.   On: 05/24/2016 12:07   Mm Diag Breast Tomo Uni Right  Result Date: 05/24/2016 CLINICAL DATA:  Screening recall for possible right breast distortion. EXAM: 2D DIGITAL DIAGNOSTIC UNILATERAL RIGHT MAMMOGRAM WITH CAD AND ADJUNCT TOMO RIGHT BREAST ULTRASOUND COMPARISON:  Previous exam(s). ACR Breast Density Category c: The breast tissue is heterogeneously dense, which may obscure small masses.  FINDINGS: CC, MLO and ML tomograms as well as spot compression CC tomograms were performed of the right breast. Initially questioned possible right breast distortion resolves on the additional imaging with findings compatible with overlapping fibroglandular tissue although heterogeneous tissue in this location could obscure a small mass. Mammographic images were processed with CAD. Physical examination of the inner right breast does not reveal any palpable masses. Targeted ultrasound of the right breast was performed. No discrete masses or abnormalities are seen, only heterogeneous fibroglandular tissue is visualized. IMPRESSION: No findings of malignancy in the right breast. RECOMMENDATION: Screening mammogram in one year.(Code:SM-B-01Y) I have discussed the findings and recommendations with the patient. Results were also provided in writing at the conclusion of the visit. If applicable, a reminder letter will be sent to the patient regarding the next appointment. BI-RADS CATEGORY  1: Negative. Electronically Signed   By: Edwin CapJennifer  Jarosz M.D.   On: 05/24/2016 12:07       Assessment & Plan:   Problem List Items Addressed This Visit    Abdominal aortic ectasia (HCC)    Found on ultrasound.  Recommended f/u in 5 years.  Due 2022.       Relevant Medications   lisinopril (PRINIVIL,ZESTRIL) 10 MG tablet   Essential hypertension    Blood pressure under good control.  Continue same medication regimen.  Follow pressures.  Follow metabolic panel.        Relevant Medications   lisinopril (PRINIVIL,ZESTRIL) 10 MG tablet   Other Relevant Orders   Basic metabolic panel   Health care maintenance    Physical today 07/04/17.  PAP 07/04/17.  Mammogram 03/22/17 - Birads 0.  F/u right breast mammogram 05/24/17 - Briads I.  Colonoscopy 06/2013 - normal.        Hypercholesterolemia    On crestor.  Low cholesterol diet and exercise.  Follow lipid panel and liver function tests.        Relevant Medications    lisinopril (PRINIVIL,ZESTRIL) 10 MG tablet   Other Relevant Orders   Hepatic function panel   Lipid panel   Stress    Doing well on citalopram.  Follow.        Vitamin D deficiency    Follow vitamin D level.       Other Visit Diagnoses    Routine general medical examination at a health care facility    -  Primary   Breast cancer screening       Relevant Orders   MM DIGITAL SCREENING BILATERAL   Screening for cervical cancer       Nasal congestion  Saline nasal spray and nasacort nasal spray as directed.  follow.         Dale Pentress, MD

## 2017-07-04 NOTE — Patient Instructions (Signed)
Saline nasal spray - flush nose at least 2-3x/day  nasacort nasal spray - 2 sprays each nostril one time per day.  Do this in the evening.  

## 2017-07-05 ENCOUNTER — Telehealth: Payer: Self-pay

## 2017-07-05 DIAGNOSIS — Z124 Encounter for screening for malignant neoplasm of cervix: Secondary | ICD-10-CM

## 2017-07-05 NOTE — Telephone Encounter (Signed)
Tammy BondsGloria [facility] stating pap smear order was put in as future needs to be put in as collected

## 2017-07-06 LAB — CYTOLOGY - PAP
Diagnosis: NEGATIVE
HPV: NOT DETECTED

## 2017-07-08 ENCOUNTER — Encounter: Payer: Self-pay | Admitting: Internal Medicine

## 2017-07-08 NOTE — Assessment & Plan Note (Signed)
Follow vitamin D level.  

## 2017-07-08 NOTE — Assessment & Plan Note (Signed)
Doing well on citalopram.  Follow.   

## 2017-07-08 NOTE — Assessment & Plan Note (Signed)
Blood pressure under good control.  Continue same medication regimen.  Follow pressures.  Follow metabolic panel.   

## 2017-07-08 NOTE — Assessment & Plan Note (Signed)
Found on ultrasound.  Recommended f/u in 5 years.  Due 2022.   

## 2017-07-08 NOTE — Assessment & Plan Note (Signed)
On crestor.  Low cholesterol diet and exercise.  Follow lipid panel and liver function tests.   

## 2017-07-10 ENCOUNTER — Encounter: Payer: Self-pay | Admitting: *Deleted

## 2017-07-18 ENCOUNTER — Ambulatory Visit
Admission: RE | Admit: 2017-07-18 | Discharge: 2017-07-18 | Disposition: A | Discharge status: Home / Self Care | Payer: BLUE CROSS/BLUE SHIELD | Source: Ambulatory Visit | Attending: Internal Medicine | Admitting: Internal Medicine

## 2017-07-18 DIAGNOSIS — Z1239 Encounter for other screening for malignant neoplasm of breast: Secondary | ICD-10-CM

## 2017-07-18 DIAGNOSIS — Z1231 Encounter for screening mammogram for malignant neoplasm of breast: Secondary | ICD-10-CM | POA: Diagnosis not present

## 2017-07-19 ENCOUNTER — Other Ambulatory Visit: Payer: Self-pay | Admitting: Internal Medicine

## 2017-11-15 ENCOUNTER — Other Ambulatory Visit: Payer: BLUE CROSS/BLUE SHIELD

## 2017-11-16 ENCOUNTER — Other Ambulatory Visit (INDEPENDENT_AMBULATORY_CARE_PROVIDER_SITE_OTHER): Payer: BLUE CROSS/BLUE SHIELD

## 2017-11-16 DIAGNOSIS — I10 Essential (primary) hypertension: Secondary | ICD-10-CM | POA: Diagnosis not present

## 2017-11-16 DIAGNOSIS — E7800 Pure hypercholesterolemia, unspecified: Secondary | ICD-10-CM

## 2017-11-16 LAB — BASIC METABOLIC PANEL
BUN: 16 mg/dL (ref 6–23)
CO2: 30 mEq/L (ref 19–32)
Calcium: 9.2 mg/dL (ref 8.4–10.5)
Chloride: 105 mEq/L (ref 96–112)
Creatinine, Ser: 0.7 mg/dL (ref 0.40–1.20)
GFR: 91.28 mL/min (ref >60.00)
Glucose, Bld: 101 mg/dL — ABNORMAL HIGH (ref 70–99)
Potassium: 4.2 mEq/L (ref 3.5–5.1)
Sodium: 141 mEq/L (ref 135–145)

## 2017-11-16 LAB — LIPID PANEL
Cholesterol: 190 mg/dL (ref 0–200)
HDL: 48.7 mg/dL (ref >39.00)
LDL Cholesterol: 109 mg/dL — ABNORMAL HIGH (ref 0–99)
NonHDL: 140.91
Total CHOL/HDL Ratio: 4
Triglycerides: 162 mg/dL — ABNORMAL HIGH (ref 0.0–149.0)
VLDL: 32.4 mg/dL (ref 0.0–40.0)

## 2017-11-16 LAB — HEPATIC FUNCTION PANEL
ALT: 23 U/L (ref 0–35)
AST: 20 U/L (ref 0–37)
Albumin: 4.2 g/dL (ref 3.5–5.2)
Alkaline Phosphatase: 79 U/L (ref 39–117)
Bilirubin, Direct: 0.1 mg/dL (ref 0.0–0.3)
Total Bilirubin: 0.5 mg/dL (ref 0.2–1.2)
Total Protein: 7.1 g/dL (ref 6.0–8.3)

## 2017-11-22 ENCOUNTER — Ambulatory Visit: Payer: BLUE CROSS/BLUE SHIELD | Admitting: Internal Medicine

## 2017-11-22 ENCOUNTER — Encounter: Payer: Self-pay | Admitting: Internal Medicine

## 2017-11-22 DIAGNOSIS — E559 Vitamin D deficiency, unspecified: Secondary | ICD-10-CM

## 2017-11-22 DIAGNOSIS — F439 Reaction to severe stress, unspecified: Secondary | ICD-10-CM

## 2017-11-22 DIAGNOSIS — I10 Essential (primary) hypertension: Secondary | ICD-10-CM

## 2017-11-22 DIAGNOSIS — I77811 Abdominal aortic ectasia: Secondary | ICD-10-CM | POA: Diagnosis not present

## 2017-11-22 DIAGNOSIS — E7800 Pure hypercholesterolemia, unspecified: Secondary | ICD-10-CM

## 2017-11-22 DIAGNOSIS — R195 Other fecal abnormalities: Secondary | ICD-10-CM

## 2017-11-22 MED ORDER — LISINOPRIL 10 MG PO TABS: 10.0000 mg | ORAL_TABLET | Freq: Every day | ORAL | 3 refills | Status: DC

## 2017-11-22 NOTE — Progress Notes (Signed)
Patient ID: Tammy Daniels, female   DOB: 08/04/1960, 57 y.o.   MRN: [id number]   Subjective:    Patient ID: Tammy Daniels, female    DOB: 01/16/1961, 57 y.o.   MRN: [id number]  HPI  Patient here for a scheduled follow up.  She reports she is doing well.  Feels good.  Trying to stay active.  No chest pain.  No sob.  No acid reflux.  No abdominal pain.  Bowels moving.  Does notice some loose stool occasionally.  May occur 1x/week.  No fever.  Does not feel sick.  No vomiting.  Taking zyrtec.  Helping.  No cough.  Blood pressure checks averaging 120s/70s.     Past Medical History:  Diagnosis Date  . Allergic rhinitis   . Elevated blood pressure   . Hypercholesterolemia    Past Surgical History:  Procedure Laterality Date  . DILATION AND CURETTAGE, DIAGNOSTIC / THERAPEUTIC  1992  . explaratory lap  1992  . HYSTEROSCOPY  1997   with cervical polyp removal   Family History  Problem Relation Age of Onset  . Hyperlipidemia Mother   . Hypertension Mother   . Heart disease Father        died 6558 MI  . Hypertension Brother   . Hypothyroidism Sister    Social History   Socioeconomic History  . Marital status: Married    Spouse name: Not on file  . Number of children: 3  . Years of education: Not on file  . Highest education level: Not on file  Occupational History    Employer: [employer]  Social Needs  . Financial resource strain: Not on file  . Food insecurity:    Worry: Not on file    Inability: Not on file  . Transportation needs:    Medical: Not on file    Non-medical: Not on file  Tobacco Use  . Smoking status: Never Smoker  . Smokeless tobacco: Never Used  Substance and Sexual Activity  . Alcohol use: Yes    Alcohol/week: 0.0 oz  . Drug use: No  . Sexual activity: Not on file  Lifestyle  . Physical activity:    Days per week: Not on file    Minutes per session: Not on file  . Stress: Not on file  Relationships  . Social connections:    Talks on phone:  Not on file    Gets together: Not on file    Attends religious service: Not on file    Active member of club or organization: Not on file    Attends meetings of clubs or organizations: Not on file    Relationship status: Not on file  Other Topics Concern  . Not on file  Social History Narrative  . Not on file    Outpatient Encounter Medications as of 11/22/2017  Medication Sig  . CALCIUM PO Take by mouth.  . cetirizine (ZYRTEC) 10 MG tablet Take 10 mg by mouth daily.  . citalopram (CELEXA) 10 MG tablet Take 1 tablet (10 mg total) by mouth daily.  Marland Kitchen. lisinopril (PRINIVIL,ZESTRIL) 10 MG tablet Take 1 tablet (10 mg total) by mouth daily.  . rosuvastatin (CRESTOR) 5 MG tablet TAKE 1 TABLET BY MOUTH ONCE DAILY  . [DISCONTINUED] lisinopril (PRINIVIL,ZESTRIL) 10 MG tablet Take 1 tablet (10 mg total) by mouth daily.   No facility-administered encounter medications on file as of 11/22/2017.     Review of Systems  Constitutional: Negative for appetite change and  unexpected weight change.  HENT: Negative for congestion and sinus pressure.   Respiratory: Negative for chest tightness and shortness of breath.        Zyrtec - cough resolved.    Cardiovascular: Negative for chest pain, palpitations and leg swelling.  Gastrointestinal: Negative for abdominal pain, diarrhea, nausea and vomiting.  Genitourinary: Negative for difficulty urinating and dysuria.  Musculoskeletal: Negative for joint swelling and myalgias.  Skin: Negative for color change and rash.  Neurological: Negative for dizziness, light-headedness and headaches.  Psychiatric/Behavioral: Negative for agitation and dysphoric mood.       Objective:    Physical Exam  Constitutional: She appears well-developed and well-nourished. No distress.  HENT:  Nose: Nose normal.  Mouth/Throat: Oropharynx is clear and moist.  Neck: Neck supple. No thyromegaly present.  Cardiovascular: Normal rate and regular rhythm.  Pulmonary/Chest:  Breath sounds normal. No respiratory distress. She has no wheezes.  Abdominal: Soft. Bowel sounds are normal. There is no tenderness.  Musculoskeletal: She exhibits no edema or tenderness.  Lymphadenopathy:    She has no cervical adenopathy.  Skin: No rash noted. No erythema.  Psychiatric: She has a normal mood and affect. Her behavior is normal.    BP 126/76 (BP Location: Left Arm, Patient Position: Sitting, Cuff Size: Normal)   Pulse 67   Temp 98 F (36.7 C) (Oral)   Resp 16   Wt 154 lb 9.6 oz (70.1 kg)   SpO2 98%   BMI 26.54 kg/m  Wt Readings from Last 3 Encounters:  11/22/17 154 lb 9.6 oz (70.1 kg)  07/04/17 154 lb 9.6 oz (70.1 kg)  11/29/16 149 lb 6.4 oz (67.8 kg)     Lab Results  Component Value Date   WBC 7.8 11/23/2016   HGB 14.7 11/23/2016   HCT 45.0 11/23/2016   PLT 257.0 11/23/2016   GLUCOSE 101 (H) 11/16/2017   CHOL 190 11/16/2017   TRIG 162.0 (H) 11/16/2017   HDL 48.70 11/16/2017   LDLDIRECT 150.0 06/27/2017   LDLCALC 109 (H) 11/16/2017   ALT 23 11/16/2017   AST 20 11/16/2017   NA 141 11/16/2017   K 4.2 11/16/2017   CL 105 11/16/2017   CREATININE 0.70 11/16/2017   BUN 16 11/16/2017   CO2 30 11/16/2017   TSH 3.54 11/23/2016    Mm Digital Screening Bilateral  Result Date: 07/19/2017 CLINICAL DATA:  Screening. EXAM: DIGITAL SCREENING BILATERAL MAMMOGRAM WITH CAD COMPARISON:  Previous exam(s). ACR Breast Density Category c: The breast tissue is heterogeneously dense, which may obscure small masses. FINDINGS: There are no findings suspicious for malignancy. Images were processed with CAD. IMPRESSION: No mammographic evidence of malignancy. A result letter of this screening mammogram will be mailed directly to the patient. RECOMMENDATION: Screening mammogram in one year. (Code:SM-B-01Y) BI-RADS CATEGORY  1: Negative. Electronically Signed   By: Norva Pavlov M.D.   On: 07/19/2017 09:24       Assessment & Plan:   Problem List Items Addressed This Visit     Abdominal aortic ectasia (HCC)    Found on ultrasound.  Recommended f/u in 5 years.  Due 2022.       Relevant Medications   lisinopril (PRINIVIL,ZESTRIL) 10 MG tablet   Essential hypertension    Blood pressure under good control.  Continue same medication regimen.  Follow pressures.  Follow metabolic panel.        Relevant Medications   lisinopril (PRINIVIL,ZESTRIL) 10 MG tablet   Other Relevant Orders   CBC with Differential/Platelet  TSH   Basic metabolic panel   Hypercholesterolemia    On crestor.  Low cholesterol diet and exercise.  Follow lipid panel and liver function tests.        Relevant Medications   lisinopril (PRINIVIL,ZESTRIL) 10 MG tablet   Other Relevant Orders   Hepatic function panel   Lipid panel   Loose stools    Had colonoscopy 06/2013 - normal.  With intermittent loose stool.  No fever.  Not persistent.  Will start probiotics.  Follow.  Notify me if persistent.        Stress    Doing well on citalopram.  Follow.       Vitamin D deficiency    Follow vitamin D level.            Dale Watson, MD

## 2017-11-26 ENCOUNTER — Encounter: Payer: Self-pay | Admitting: Internal Medicine

## 2017-11-26 NOTE — Assessment & Plan Note (Signed)
Found on ultrasound.  Recommended f/u in 5 years.  Due 2022.   

## 2017-11-26 NOTE — Assessment & Plan Note (Signed)
Blood pressure under good control.  Continue same medication regimen.  Follow pressures.  Follow metabolic panel.   

## 2017-11-26 NOTE — Assessment & Plan Note (Signed)
Doing well on citalopram.  Follow.   

## 2017-11-26 NOTE — Assessment & Plan Note (Signed)
On crestor.  Low cholesterol diet and exercise.  Follow lipid panel and liver function tests.   

## 2017-11-26 NOTE — Assessment & Plan Note (Signed)
Follow vitamin D level.  

## 2017-11-26 NOTE — Assessment & Plan Note (Signed)
Had colonoscopy 06/2013 - normal.  With intermittent loose stool.  No fever.  Not persistent.  Will start probiotics.  Follow.  Notify me if persistent.

## 2018-02-01 ENCOUNTER — Other Ambulatory Visit: Payer: Self-pay

## 2018-02-01 MED ORDER — CITALOPRAM HYDROBROMIDE 10 MG PO TABS: 10.0000 mg | ORAL_TABLET | Freq: Every day | ORAL | 0 refills | Status: DC

## 2018-04-03 ENCOUNTER — Other Ambulatory Visit (INDEPENDENT_AMBULATORY_CARE_PROVIDER_SITE_OTHER): Payer: BLUE CROSS/BLUE SHIELD

## 2018-04-03 DIAGNOSIS — E7800 Pure hypercholesterolemia, unspecified: Secondary | ICD-10-CM | POA: Diagnosis not present

## 2018-04-03 DIAGNOSIS — I10 Essential (primary) hypertension: Secondary | ICD-10-CM

## 2018-04-03 LAB — TSH: TSH: 2.36 u[IU]/mL (ref 0.35–4.50)

## 2018-04-03 LAB — LIPID PANEL
Cholesterol: 173 mg/dL (ref 0–200)
HDL: 51.1 mg/dL
LDL Cholesterol: 99 mg/dL (ref 0–99)
NonHDL: 121.52
Total CHOL/HDL Ratio: 3
Triglycerides: 115 mg/dL (ref 0.0–149.0)
VLDL: 23 mg/dL (ref 0.0–40.0)

## 2018-04-03 LAB — BASIC METABOLIC PANEL
BUN: 20 mg/dL (ref 6–23)
CO2: 28 mEq/L (ref 19–32)
Calcium: 9.1 mg/dL (ref 8.4–10.5)
Chloride: 107 mEq/L (ref 96–112)
Creatinine, Ser: 0.76 mg/dL (ref 0.40–1.20)
GFR: 82.9 mL/min (ref >60.00)
Glucose, Bld: 107 mg/dL — ABNORMAL HIGH (ref 70–99)
Potassium: 4 mEq/L (ref 3.5–5.1)
Sodium: 141 mEq/L (ref 135–145)

## 2018-04-03 LAB — CBC WITH DIFFERENTIAL/PLATELET
Basophils Absolute: 0.1 10*3/uL (ref 0.0–0.1)
Basophils Relative: 0.7 % (ref 0.0–3.0)
Eosinophils Absolute: 0.4 10*3/uL (ref 0.0–0.7)
Eosinophils Relative: 5.5 % — ABNORMAL HIGH (ref 0.0–5.0)
HCT: 46.2 % — ABNORMAL HIGH (ref 36.0–46.0)
Hemoglobin: 15.2 g/dL — ABNORMAL HIGH (ref 12.0–15.0)
Lymphocytes Relative: 30.3 % (ref 12.0–46.0)
Lymphs Abs: 2.3 10*3/uL (ref 0.7–4.0)
MCHC: 32.9 g/dL (ref 30.0–36.0)
MCV: 89.7 fl (ref 78.0–100.0)
Monocytes Absolute: 0.5 10*3/uL (ref 0.1–1.0)
Monocytes Relative: 6.8 % (ref 3.0–12.0)
Neutro Abs: 4.3 10*3/uL (ref 1.4–7.7)
Neutrophils Relative %: 56.7 % (ref 43.0–77.0)
Platelets: 255 10*3/uL (ref 150.0–400.0)
RBC: 5.15 Mil/uL — ABNORMAL HIGH (ref 3.87–5.11)
RDW: 13 % (ref 11.5–15.5)
WBC: 7.6 10*3/uL (ref 4.0–10.5)

## 2018-04-03 LAB — HEPATIC FUNCTION PANEL
ALT: 13 U/L (ref 0–35)
AST: 16 U/L (ref 0–37)
Albumin: 4.3 g/dL (ref 3.5–5.2)
Alkaline Phosphatase: 79 U/L (ref 39–117)
Bilirubin, Direct: 0.1 mg/dL (ref 0.0–0.3)
Total Bilirubin: 0.5 mg/dL (ref 0.2–1.2)
Total Protein: 7 g/dL (ref 6.0–8.3)

## 2018-04-04 ENCOUNTER — Ambulatory Visit: Payer: BLUE CROSS/BLUE SHIELD | Admitting: Internal Medicine

## 2018-04-04 ENCOUNTER — Encounter: Payer: Self-pay | Admitting: Internal Medicine

## 2018-04-04 DIAGNOSIS — R739 Hyperglycemia, unspecified: Secondary | ICD-10-CM

## 2018-04-04 DIAGNOSIS — Z9109 Other allergy status, other than to drugs and biological substances: Secondary | ICD-10-CM

## 2018-04-04 DIAGNOSIS — I77811 Abdominal aortic ectasia: Secondary | ICD-10-CM

## 2018-04-04 DIAGNOSIS — I10 Essential (primary) hypertension: Secondary | ICD-10-CM | POA: Diagnosis not present

## 2018-04-04 DIAGNOSIS — E559 Vitamin D deficiency, unspecified: Secondary | ICD-10-CM

## 2018-04-04 DIAGNOSIS — E7800 Pure hypercholesterolemia, unspecified: Secondary | ICD-10-CM

## 2018-04-04 DIAGNOSIS — F439 Reaction to severe stress, unspecified: Secondary | ICD-10-CM

## 2018-04-04 MED ORDER — LEVOCETIRIZINE DIHYDROCHLORIDE 5 MG PO TABS: 5.0000 mg | ORAL_TABLET | Freq: Every day | ORAL | 2 refills | Status: DC

## 2018-04-04 NOTE — Progress Notes (Signed)
Patient ID: Tammy Daniels, female   DOB: Aug 10, 1960, 57 y.o.   MRN: [id number]   Subjective:    Patient ID: Tammy Daniels, female    DOB: [birth year], 57 y.o.   MRN: [id number]  HPI  Patient here for a scheduled follow up.  She reports she is doing relatively well.  Some allergy symptoms with some sinus congestion and cough.  No fever.  No significant sinus pressure.  No chest congestion.  No acid reflux.  No abdominal pain.  Bowels moving.  No urine change.  Discussed labs.     Past Medical History:  Diagnosis Date  . Allergic rhinitis   . Elevated blood pressure   . Hypercholesterolemia    Past Surgical History:  Procedure Laterality Date  . DILATION AND CURETTAGE, DIAGNOSTIC / THERAPEUTIC  1992  . explaratory lap  1992  . HYSTEROSCOPY  1997   with cervical polyp removal   Family History  Problem Relation Age of Onset  . Hyperlipidemia Mother   . Hypertension Mother   . Heart disease Father        died 69 MI  . Hypertension Brother   . Hypothyroidism Sister    Social History   Socioeconomic History  . Marital status: Married    Spouse name: Not on file  . Number of children: 3  . Years of education: Not on file  . Highest education level: Not on file  Occupational History    Employer: [employer]  Social Needs  . Financial resource strain: Not on file  . Food insecurity:    Worry: Not on file    Inability: Not on file  . Transportation needs:    Medical: Not on file    Non-medical: Not on file  Tobacco Use  . Smoking status: Never Smoker  . Smokeless tobacco: Never Used  Substance and Sexual Activity  . Alcohol use: Yes    Alcohol/week: 0.0 standard drinks  . Drug use: No  . Sexual activity: Not on file  Lifestyle  . Physical activity:    Days per week: Not on file    Minutes per session: Not on file  . Stress: Not on file  Relationships  . Social connections:    Talks on phone: Not on file    Gets together: Not on file    Attends religious  service: Not on file    Active member of club or organization: Not on file    Attends meetings of clubs or organizations: Not on file    Relationship status: Not on file  Other Topics Concern  . Not on file  Social History Narrative  . Not on file    Outpatient Encounter Medications as of 04/04/2018  Medication Sig  . CALCIUM PO Take by mouth.  . citalopram (CELEXA) 10 MG tablet Take 1 tablet (10 mg total) by mouth daily.  Marland Kitchen levocetirizine (XYZAL) 5 MG tablet Take 1 tablet (5 mg total) by mouth daily.  Marland Kitchen lisinopril (PRINIVIL,ZESTRIL) 10 MG tablet Take 1 tablet (10 mg total) by mouth daily.  . rosuvastatin (CRESTOR) 5 MG tablet TAKE 1 TABLET BY MOUTH ONCE DAILY  . [DISCONTINUED] cetirizine (ZYRTEC) 10 MG tablet Take 10 mg by mouth daily.   No facility-administered encounter medications on file as of 04/04/2018.     Review of Systems  Constitutional: Negative for appetite change and unexpected weight change.  HENT: Positive for congestion and postnasal drip. Negative for sinus pressure.   Respiratory: Positive for  cough. Negative for chest tightness and shortness of breath.   Cardiovascular: Negative for chest pain, palpitations and leg swelling.  Gastrointestinal: Negative for abdominal pain, diarrhea, nausea and vomiting.  Genitourinary: Negative for difficulty urinating and dysuria.  Musculoskeletal: Negative for joint swelling and myalgias.  Skin: Negative for color change and rash.  Neurological: Negative for dizziness, light-headedness and headaches.  Psychiatric/Behavioral: Negative for agitation and dysphoric mood.       Objective:    Physical Exam Constitutional:      General: She is not in acute distress.    Appearance: Normal appearance.  HENT:     Nose: Nose normal.     Mouth/Throat:     Pharynx: No oropharyngeal exudate or posterior oropharyngeal erythema.  Neck:     Musculoskeletal: Neck supple. No muscular tenderness.     Thyroid: No thyromegaly.    Cardiovascular:     Rate and Rhythm: Normal rate and regular rhythm.  Pulmonary:     Effort: No respiratory distress.     Breath sounds: Normal breath sounds. No wheezing.  Abdominal:     General: Bowel sounds are normal.     Palpations: Abdomen is soft.     Tenderness: There is no abdominal tenderness.  Musculoskeletal:        General: No swelling or tenderness.  Lymphadenopathy:     Cervical: No cervical adenopathy.  Neurological:     Mental Status: She is alert.  Psychiatric:        Mood and Affect: Mood normal.        Thought Content: Thought content normal.     BP 128/76 (BP Location: Left Arm, Patient Position: Sitting, Cuff Size: Normal)   Pulse (!) 57   Temp 97.6 F (36.4 C) (Oral)   Resp 16   Wt 152 lb 9.6 oz (69.2 kg)   SpO2 98%   BMI 26.19 kg/m  Wt Readings from Last 3 Encounters:  04/04/18 152 lb 9.6 oz (69.2 kg)  11/22/17 154 lb 9.6 oz (70.1 kg)  07/04/17 154 lb 9.6 oz (70.1 kg)     Lab Results  Component Value Date   WBC 7.6 04/03/2018   HGB 15.2 (H) 04/03/2018   HCT 46.2 (H) 04/03/2018   PLT 255.0 04/03/2018   GLUCOSE 107 (H) 04/03/2018   CHOL 173 04/03/2018   TRIG 115.0 04/03/2018   HDL 51.10 04/03/2018   LDLDIRECT 150.0 06/27/2017   LDLCALC 99 04/03/2018   ALT 13 04/03/2018   AST 16 04/03/2018   NA 141 04/03/2018   K 4.0 04/03/2018   CL 107 04/03/2018   CREATININE 0.76 04/03/2018   BUN 20 04/03/2018   CO2 28 04/03/2018   TSH 2.36 04/03/2018    Mm Digital Screening Bilateral  Result Date: 07/19/2017 CLINICAL DATA:  Screening. EXAM: DIGITAL SCREENING BILATERAL MAMMOGRAM WITH CAD COMPARISON:  Previous exam(s). ACR Breast Density Category c: The breast tissue is heterogeneously dense, which may obscure small masses. FINDINGS: There are no findings suspicious for malignancy. Images were processed with CAD. IMPRESSION: No mammographic evidence of malignancy. A result letter of this screening mammogram will be mailed directly to the patient.  RECOMMENDATION: Screening mammogram in one year. (Code:SM-B-01Y) BI-RADS CATEGORY  1: Negative. Electronically Signed   By: Nolon Nations M.D.   On: 07/19/2017 09:24       Assessment & Plan:   Problem List Items Addressed This Visit    Abdominal aortic ectasia ([place])    Found on ultrasound.  Recommended f/u in 5  years.  Due 2022.        Environmental allergies    Stop zyrtec.  Start xyzal.  nasacort as directed.  Follow.        Essential hypertension    Blood pressure under good control.  Continue same medication regimen.  Follow pressures.  Follow metabolic panel.        Hypercholesterolemia    On crestor.  Low cholesterol diet and exercise.  Follow lipid panel and liver function tests.        Hyperglycemia    Low carb iet and exercise.  Follow met b and a1c.        Stress    Doing well on citalopram.        Vitamin D deficiency    Follow vitamin D level.            Einar Pheasant, MD

## 2018-04-04 NOTE — Patient Instructions (Signed)
nasacort nasal spray - 2 sprays each nostril one time per day.  Do this in the evening.   

## 2018-04-09 ENCOUNTER — Encounter: Payer: Self-pay | Admitting: Internal Medicine

## 2018-04-09 DIAGNOSIS — R739 Hyperglycemia, unspecified: Secondary | ICD-10-CM | POA: Insufficient documentation

## 2018-04-09 DIAGNOSIS — Z9109 Other allergy status, other than to drugs and biological substances: Secondary | ICD-10-CM | POA: Insufficient documentation

## 2018-04-09 NOTE — Assessment & Plan Note (Addendum)
Stop zyrtec.  Start xyzal.  nasacort as directed.  Follow.

## 2018-04-09 NOTE — Assessment & Plan Note (Signed)
Follow vitamin D level.  

## 2018-04-09 NOTE — Assessment & Plan Note (Signed)
On crestor.  Low cholesterol diet and exercise.  Follow lipid panel and liver function tests.   

## 2018-04-09 NOTE — Assessment & Plan Note (Signed)
Found on ultrasound.  Recommended f/u in 5 years.  Due 2022.   

## 2018-04-09 NOTE — Assessment & Plan Note (Signed)
Doing well on citalopram.

## 2018-04-09 NOTE — Assessment & Plan Note (Signed)
Blood pressure under good control.  Continue same medication regimen.  Follow pressures.  Follow metabolic panel.   

## 2018-04-09 NOTE — Assessment & Plan Note (Signed)
Low carb iet and exercise.  Follow met b and a1c.

## 2018-05-20 ENCOUNTER — Other Ambulatory Visit: Payer: Self-pay | Admitting: Internal Medicine

## 2018-07-17 ENCOUNTER — Ambulatory Visit: Payer: BLUE CROSS/BLUE SHIELD | Admitting: Internal Medicine

## 2018-07-17 ENCOUNTER — Other Ambulatory Visit: Payer: Self-pay

## 2018-09-06 ENCOUNTER — Other Ambulatory Visit: Payer: Self-pay | Admitting: Internal Medicine

## 2018-10-03 ENCOUNTER — Ambulatory Visit: Payer: BLUE CROSS/BLUE SHIELD | Admitting: Internal Medicine

## 2018-11-30 IMAGING — MG MM DIGITAL SCREENING BILAT W/ CAD
4 series · 4 of 4 positions shown · non-contrast
Comparison: Previous exam(s).

CLINICAL DATA: Screening.

EXAM:
DIGITAL SCREENING BILATERAL MAMMOGRAM WITH CAD

[R MLO]
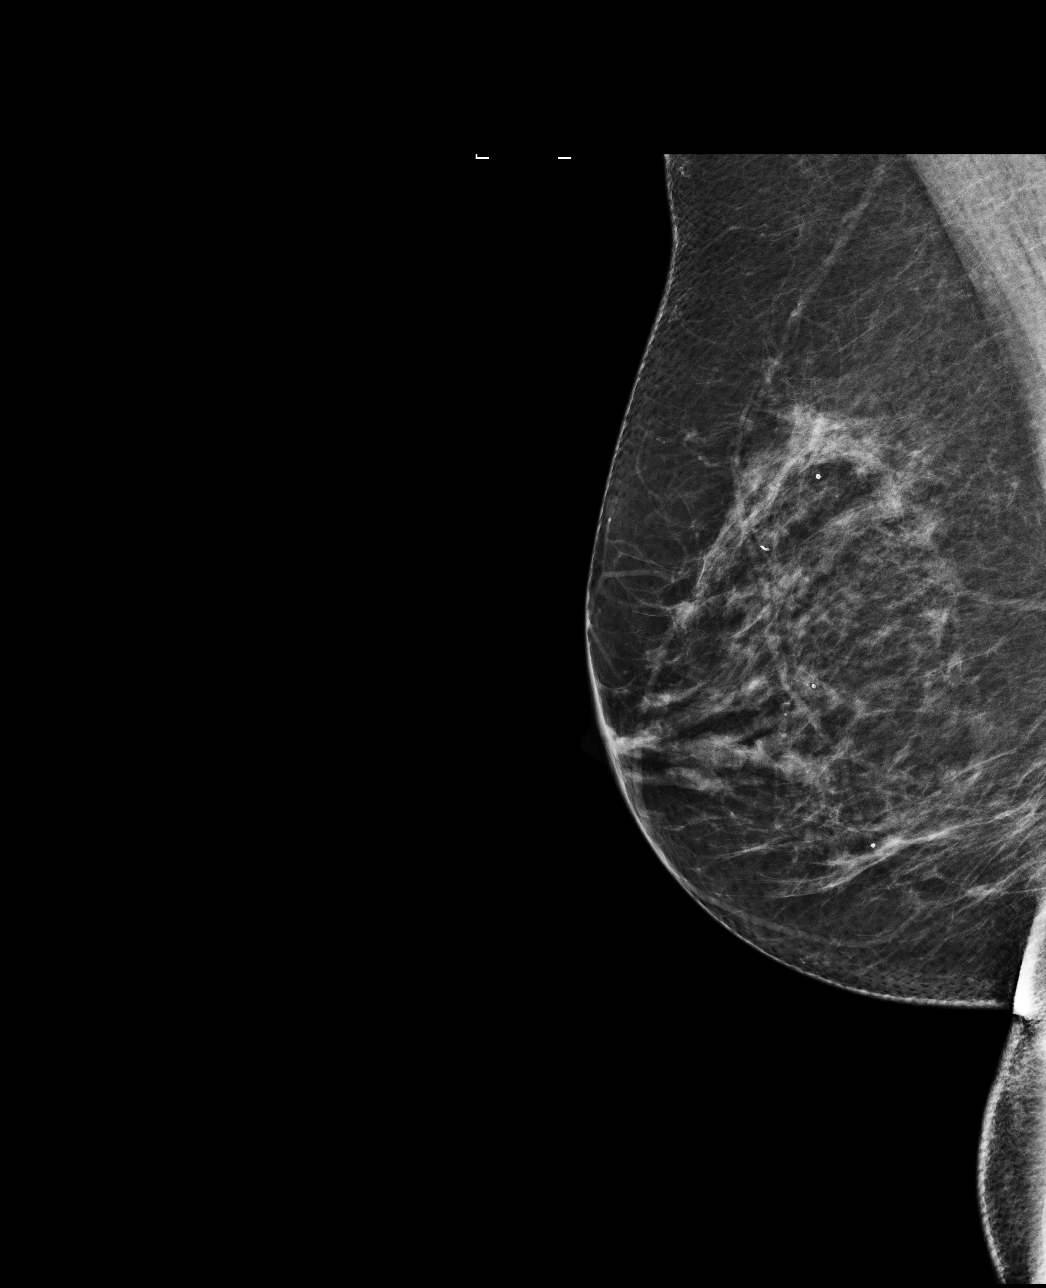

[L CC]
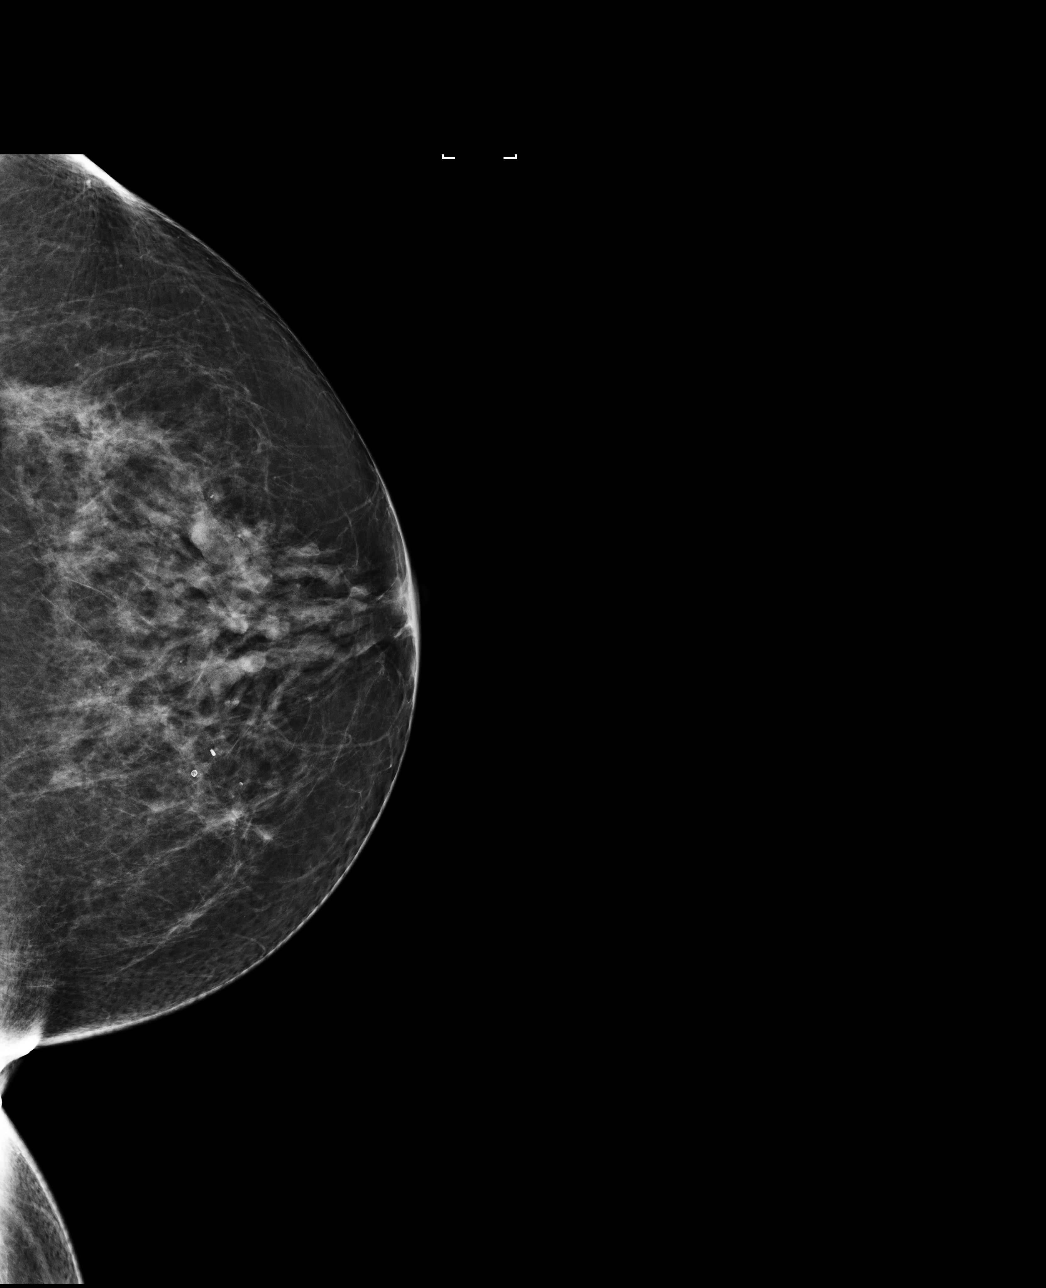

[L MLO]
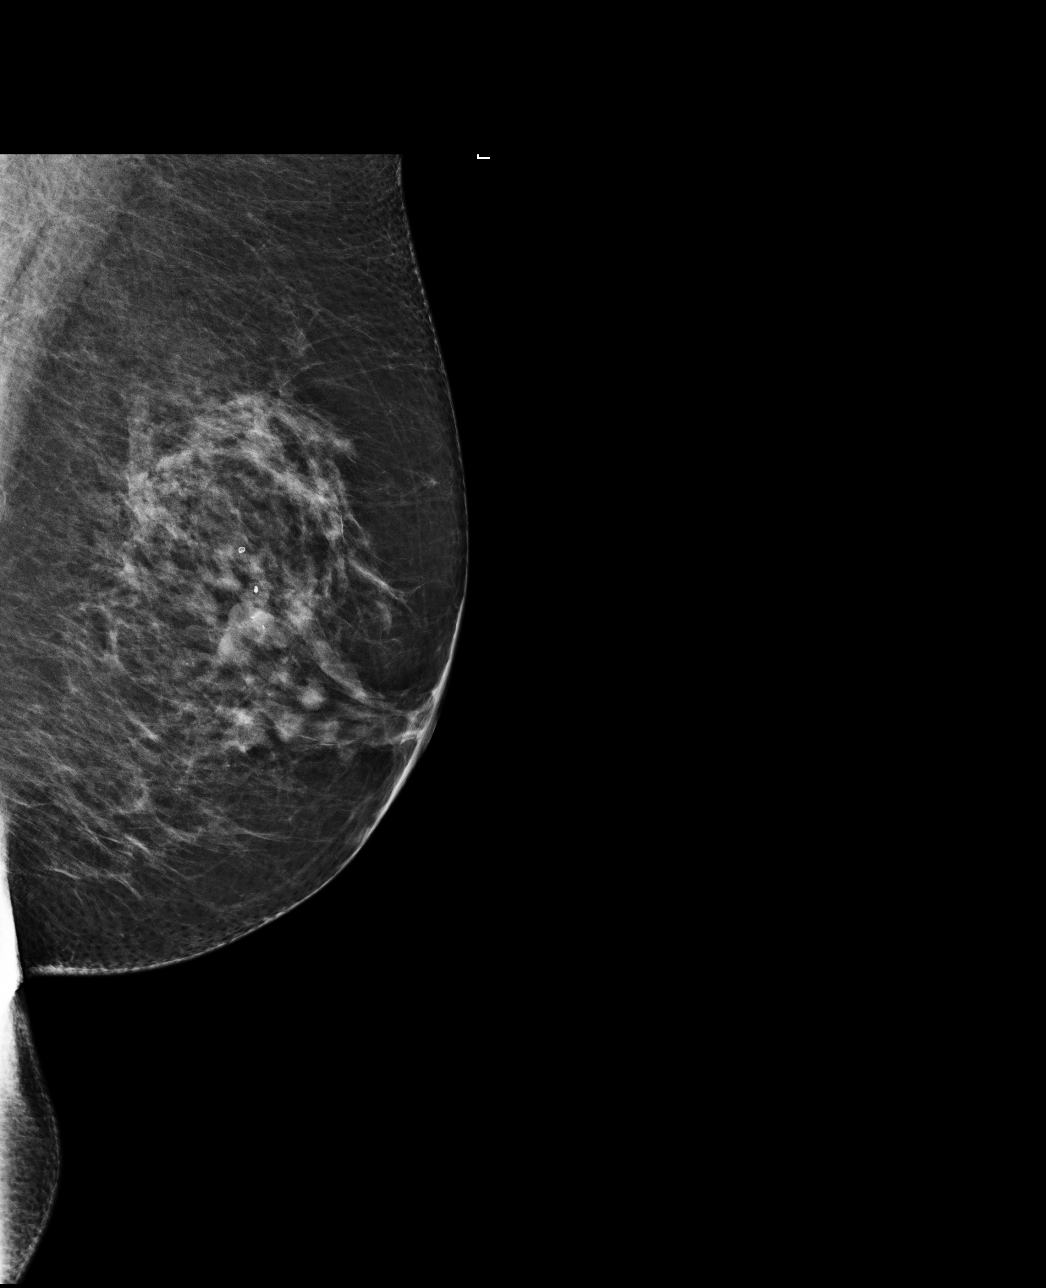

[R CC]
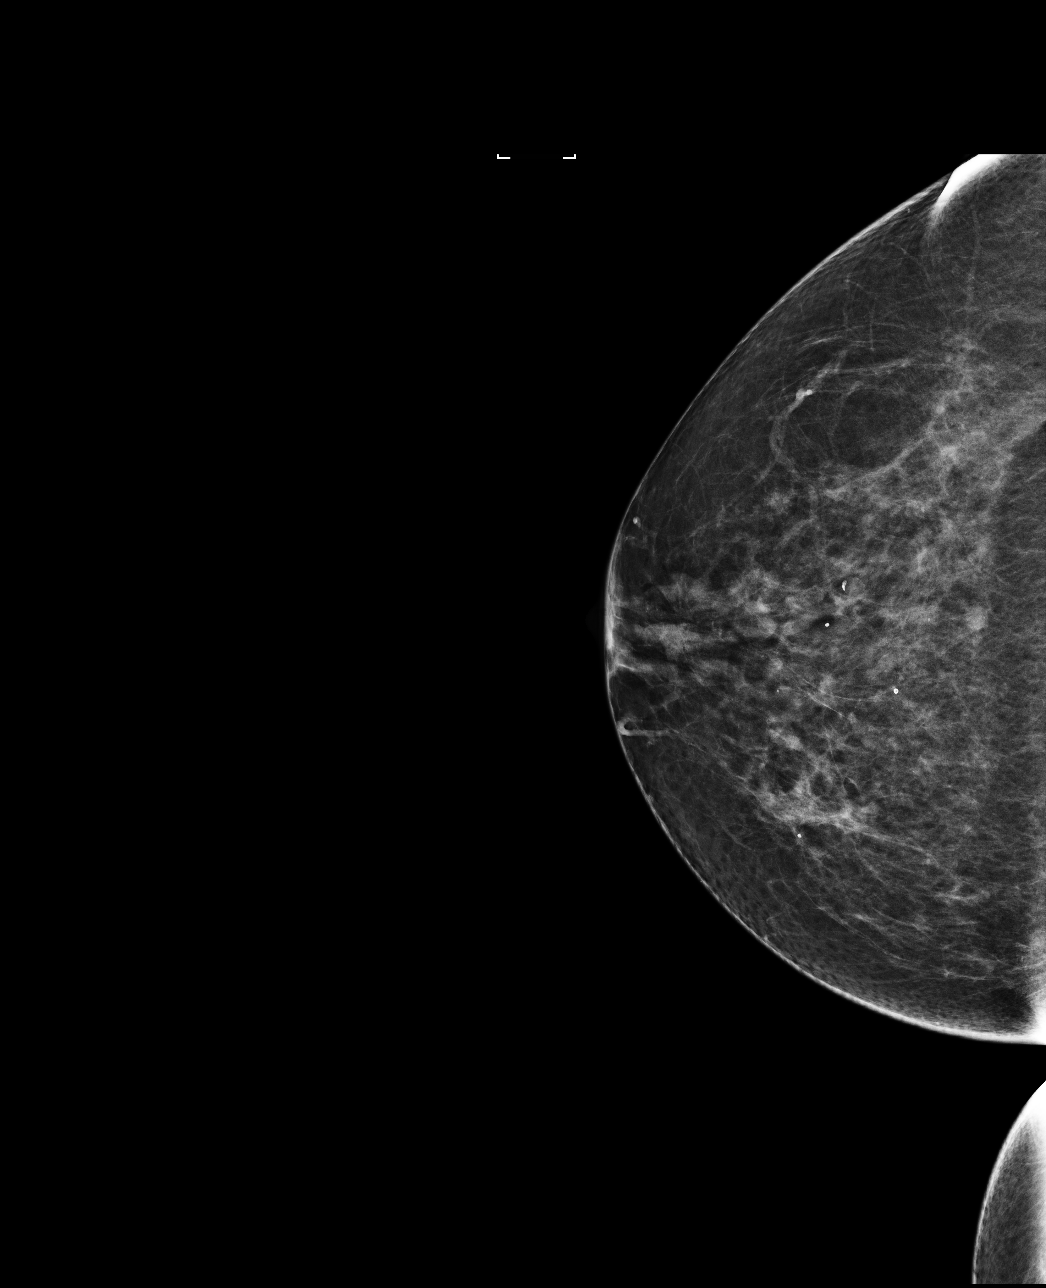

[4 of 4 positions shown; findings below may reference images not displayed]

ACR Breast Density Category c: The breast tissue is heterogeneously
dense, which may obscure small masses.
FINDINGS: There are no findings suspicious for malignancy. Images were
processed with CAD.
IMPRESSION: No mammographic evidence of malignancy. A result letter of this
screening mammogram will be mailed directly to the patient.

RECOMMENDATION:
Screening mammogram in one year. (Code:YJ-2-FEZ)

BI-RADS CATEGORY  1: Negative.

## 2018-12-24 ENCOUNTER — Other Ambulatory Visit: Payer: Self-pay

## 2018-12-25 ENCOUNTER — Other Ambulatory Visit: Payer: Self-pay

## 2018-12-25 ENCOUNTER — Ambulatory Visit: Payer: BC Managed Care – PPO | Admitting: Internal Medicine

## 2018-12-25 ENCOUNTER — Encounter: Payer: Self-pay | Admitting: Internal Medicine

## 2018-12-25 VITALS — BP 130/78 | HR 67 | Temp 97.4°F | Resp 16 | Ht 64.0 in | Wt 152.6 lb

## 2018-12-25 DIAGNOSIS — I77811 Abdominal aortic ectasia: Secondary | ICD-10-CM

## 2018-12-25 DIAGNOSIS — F439 Reaction to severe stress, unspecified: Secondary | ICD-10-CM

## 2018-12-25 DIAGNOSIS — Z1231 Encounter for screening mammogram for malignant neoplasm of breast: Secondary | ICD-10-CM | POA: Diagnosis not present

## 2018-12-25 DIAGNOSIS — R739 Hyperglycemia, unspecified: Secondary | ICD-10-CM

## 2018-12-25 DIAGNOSIS — E7800 Pure hypercholesterolemia, unspecified: Secondary | ICD-10-CM

## 2018-12-25 DIAGNOSIS — I10 Essential (primary) hypertension: Secondary | ICD-10-CM | POA: Diagnosis not present

## 2018-12-25 DIAGNOSIS — Z9109 Other allergy status, other than to drugs and biological substances: Secondary | ICD-10-CM

## 2018-12-25 DIAGNOSIS — E559 Vitamin D deficiency, unspecified: Secondary | ICD-10-CM

## 2018-12-25 MED ORDER — LEVOCETIRIZINE DIHYDROCHLORIDE 5 MG PO TABS: 5.0000 mg | ORAL_TABLET | Freq: Every day | ORAL | 1 refills | Status: DC

## 2018-12-25 MED ORDER — LORAZEPAM 0.5 MG PO TABS: ORAL_TABLET | ORAL | 0 refills | Status: DC

## 2018-12-25 NOTE — Progress Notes (Signed)
Patient ID: Tammy Daniels, female   DOB: 03-03-1961, 58 y.o.   MRN: [id number]   Subjective:    Patient ID: Tammy Daniels, female    DOB: 1960-10-24, 58 y.o.   MRN: [id number]  HPI  Patient here for a scheduled follow up. She reports she is doing relatively well.  Increased stress related some family issues.  Discussed with her today.  She is on citalopram.  Feel needs something to have as needed.  No chest pain.  No sob.  No acid reflux.  No abdominal pain.  Bowels moving.    Past Medical History:  Diagnosis Date  . Allergic rhinitis   . Elevated blood pressure   . Hypercholesterolemia    Past Surgical History:  Procedure Laterality Date  . DILATION AND CURETTAGE, DIAGNOSTIC / THERAPEUTIC  1992  . explaratory lap  1992  . HYSTEROSCOPY  1997   with cervical polyp removal   Family History  Problem Relation Age of Onset  . Hyperlipidemia Mother   . Hypertension Mother   . Heart disease Father        died 19 MI  . Hypertension Brother   . Hypothyroidism Sister    Social History   Socioeconomic History  . Marital status: Married    Spouse name: Not on file  . Number of children: 3  . Years of education: Not on file  . Highest education level: Not on file  Occupational History    Employer: [employer]  Social Needs  . Financial resource strain: Not on file  . Food insecurity    Worry: Not on file    Inability: Not on file  . Transportation needs    Medical: Not on file    Non-medical: Not on file  Tobacco Use  . Smoking status: Never Smoker  . Smokeless tobacco: Never Used  Substance and Sexual Activity  . Alcohol use: Yes    Alcohol/week: 0.0 standard drinks  . Drug use: No  . Sexual activity: Not on file  Lifestyle  . Physical activity    Days per week: Not on file    Minutes per session: Not on file  . Stress: Not on file  Relationships  . Social Herbalist on phone: Not on file    Gets together: Not on file    Attends religious service:  Not on file    Active member of club or organization: Not on file    Attends meetings of clubs or organizations: Not on file    Relationship status: Not on file  Other Topics Concern  . Not on file  Social History Narrative  . Not on file    Outpatient Encounter Medications as of 12/25/2018  Medication Sig  . CALCIUM PO Take by mouth.  . levocetirizine (XYZAL) 5 MG tablet Take 1 tablet (5 mg total) by mouth daily.  . [DISCONTINUED] citalopram (CELEXA) 10 MG tablet Take 1 tablet by mouth once daily  . [DISCONTINUED] levocetirizine (XYZAL) 5 MG tablet TAKE 1 TABLET BY MOUTH ONCE DAILY  . [DISCONTINUED] lisinopril (ZESTRIL) 10 MG tablet Take 1 tablet by mouth once daily  . [DISCONTINUED] rosuvastatin (CRESTOR) 5 MG tablet TAKE 1 TABLET BY MOUTH ONCE DAILY  . LORazepam (ATIVAN) 0.5 MG tablet Take 1/2 tablet q day prn   No facility-administered encounter medications on file as of 12/25/2018.     Review of Systems     Objective:    Physical Exam  BP 130/78  Pulse 67   Temp (!) 97.4 F (36.3 C) (Temporal)   Resp 16   Ht 5' 4"  (1.626 m)   Wt 152 lb 9.6 oz (69.2 kg)   SpO2 97%   BMI 26.19 kg/m  Wt Readings from Last 3 Encounters:  12/25/18 152 lb 9.6 oz (69.2 kg)  04/04/18 152 lb 9.6 oz (69.2 kg)  11/22/17 154 lb 9.6 oz (70.1 kg)     Lab Results  Component Value Date   WBC 7.6 04/03/2018   HGB 15.2 (H) 04/03/2018   HCT 46.2 (H) 04/03/2018   PLT 255.0 04/03/2018   GLUCOSE 107 (H) 04/03/2018   CHOL 173 04/03/2018   TRIG 115.0 04/03/2018   HDL 51.10 04/03/2018   LDLDIRECT 150.0 06/27/2017   LDLCALC 99 04/03/2018   ALT 13 04/03/2018   AST 16 04/03/2018   NA 141 04/03/2018   K 4.0 04/03/2018   CL 107 04/03/2018   CREATININE 0.76 04/03/2018   BUN 20 04/03/2018   CO2 28 04/03/2018   TSH 2.36 04/03/2018    Mm Digital Screening Bilateral  Result Date: 07/19/2017 CLINICAL DATA:  Screening. EXAM: DIGITAL SCREENING BILATERAL MAMMOGRAM WITH CAD COMPARISON:  Previous  exam(s). ACR Breast Density Category c: The breast tissue is heterogeneously dense, which may obscure small masses. FINDINGS: There are no findings suspicious for malignancy. Images were processed with CAD. IMPRESSION: No mammographic evidence of malignancy. A result letter of this screening mammogram will be mailed directly to the patient. RECOMMENDATION: Screening mammogram in one year. (Code:SM-B-01Y) BI-RADS CATEGORY  1: Negative. Electronically Signed   By: Nolon Nations M.D.   On: 07/19/2017 09:24       Assessment & Plan:   Problem List Items Addressed This Visit    Abdominal aortic ectasia ([place])    Found on ultrasound.  Recommended f/u in 5 years.  Due 2022.        Environmental allergies    Controlled.        Essential hypertension    Blood pressure under good control.  Continue same medication regimen.  Follow pressures.  Follow metabolic panel.        Relevant Orders   CBC with Differential/Platelet   Basic metabolic panel   Hypercholesterolemia    On crestor.  Low cholesterol diet and exercise.  Follow lipid panel and liver function tests.        Relevant Orders   Hepatic function panel   Lipid panel   Hyperglycemia    Low carb diet and exercise.  Follow met b and a1c.       Relevant Orders   Hemoglobin A1c   Stress    On citalopram. Increased stress with family issues.  Discussed with her today.  Feels she needs something to have if needed.  Discussed lorazepam.        Vitamin D deficiency    Follow vitamin D level.         Other Visit Diagnoses    Visit for screening mammogram    -  Primary   Relevant Orders   MM 3D SCREEN BREAST BILATERAL       Einar Pheasant, MD

## 2018-12-26 ENCOUNTER — Other Ambulatory Visit: Payer: Self-pay | Admitting: Internal Medicine

## 2018-12-30 ENCOUNTER — Encounter: Payer: Self-pay | Admitting: Internal Medicine

## 2018-12-30 NOTE — Assessment & Plan Note (Signed)
On citalopram. Increased stress with family issues.  Discussed with her today.  Feels she needs something to have if needed.  Discussed lorazepam.

## 2018-12-30 NOTE — Assessment & Plan Note (Addendum)
Blood pressure under good control.  Continue same medication regimen.  Follow pressures.  Follow metabolic panel.   

## 2018-12-30 NOTE — Assessment & Plan Note (Signed)
Found on ultrasound.  Recommended f/u in 5 years.  Due 2022.   

## 2018-12-30 NOTE — Assessment & Plan Note (Signed)
Controlled.  

## 2018-12-30 NOTE — Assessment & Plan Note (Signed)
On crestor.  Low cholesterol diet and exercise.  Follow lipid panel and liver function tests.   

## 2018-12-30 NOTE — Assessment & Plan Note (Signed)
Follow vitamin D level.  

## 2018-12-30 NOTE — Assessment & Plan Note (Signed)
Low carb diet and exercise.  Follow met b and a1c.

## 2019-01-22 ENCOUNTER — Other Ambulatory Visit (INDEPENDENT_AMBULATORY_CARE_PROVIDER_SITE_OTHER): Payer: BC Managed Care – PPO

## 2019-01-22 ENCOUNTER — Other Ambulatory Visit: Payer: Self-pay

## 2019-01-22 DIAGNOSIS — I10 Essential (primary) hypertension: Secondary | ICD-10-CM | POA: Diagnosis not present

## 2019-01-22 DIAGNOSIS — E7800 Pure hypercholesterolemia, unspecified: Secondary | ICD-10-CM

## 2019-01-22 DIAGNOSIS — R739 Hyperglycemia, unspecified: Secondary | ICD-10-CM | POA: Diagnosis not present

## 2019-01-22 LAB — HEMOGLOBIN A1C: Hgb A1c MFr Bld: 6.1 % (ref 4.6–6.5)

## 2019-01-22 LAB — LIPID PANEL
Cholesterol: 203 mg/dL — ABNORMAL HIGH (ref 0–200)
HDL: 54.7 mg/dL (ref >39.00)
NonHDL: 148.63
Total CHOL/HDL Ratio: 4
Triglycerides: 210 mg/dL — ABNORMAL HIGH (ref 0.0–149.0)
VLDL: 42 mg/dL — ABNORMAL HIGH (ref 0.0–40.0)

## 2019-01-22 LAB — BASIC METABOLIC PANEL
BUN: 15 mg/dL (ref 6–23)
CO2: 28 mEq/L (ref 19–32)
Calcium: 9.5 mg/dL (ref 8.4–10.5)
Chloride: 105 mEq/L (ref 96–112)
Creatinine, Ser: 0.7 mg/dL (ref 0.40–1.20)
GFR: 85.53 mL/min (ref >60.00)
Glucose, Bld: 98 mg/dL (ref 70–99)
Potassium: 4.2 mEq/L (ref 3.5–5.1)
Sodium: 142 mEq/L (ref 135–145)

## 2019-01-22 LAB — CBC WITH DIFFERENTIAL/PLATELET
Basophils Absolute: 0 10*3/uL (ref 0.0–0.1)
Basophils Relative: 0.7 % (ref 0.0–3.0)
Eosinophils Absolute: 0.4 10*3/uL (ref 0.0–0.7)
Eosinophils Relative: 6.7 % — ABNORMAL HIGH (ref 0.0–5.0)
HCT: 44.7 % (ref 36.0–46.0)
Hemoglobin: 14.9 g/dL (ref 12.0–15.0)
Lymphocytes Relative: 31.6 % (ref 12.0–46.0)
Lymphs Abs: 2 10*3/uL (ref 0.7–4.0)
MCHC: 33.3 g/dL (ref 30.0–36.0)
MCV: 89.9 fl (ref 78.0–100.0)
Monocytes Absolute: 0.5 10*3/uL (ref 0.1–1.0)
Monocytes Relative: 7.4 % (ref 3.0–12.0)
Neutro Abs: 3.4 10*3/uL (ref 1.4–7.7)
Neutrophils Relative %: 53.6 % (ref 43.0–77.0)
Platelets: 253 10*3/uL (ref 150.0–400.0)
RBC: 4.97 Mil/uL (ref 3.87–5.11)
RDW: 13.3 % (ref 11.5–15.5)
WBC: 6.4 10*3/uL (ref 4.0–10.5)

## 2019-01-22 LAB — HEPATIC FUNCTION PANEL
ALT: 17 U/L (ref 0–35)
AST: 14 U/L (ref 0–37)
Albumin: 4.3 g/dL (ref 3.5–5.2)
Alkaline Phosphatase: 84 U/L (ref 39–117)
Bilirubin, Direct: 0.1 mg/dL (ref 0.0–0.3)
Total Bilirubin: 0.5 mg/dL (ref 0.2–1.2)
Total Protein: 6.8 g/dL (ref 6.0–8.3)

## 2019-01-22 LAB — LDL CHOLESTEROL, DIRECT: Direct LDL: 128 mg/dL

## 2019-01-30 ENCOUNTER — Ambulatory Visit
Admission: RE | Admit: 2019-01-30 | Discharge: 2019-01-30 | Disposition: A | Discharge status: Home / Self Care | Payer: BC Managed Care – PPO | Source: Ambulatory Visit | Attending: Internal Medicine | Admitting: Internal Medicine

## 2019-01-30 DIAGNOSIS — Z1231 Encounter for screening mammogram for malignant neoplasm of breast: Secondary | ICD-10-CM | POA: Insufficient documentation

## 2019-03-04 ENCOUNTER — Telehealth: Payer: Self-pay | Admitting: Internal Medicine

## 2019-03-04 ENCOUNTER — Telehealth: Payer: Self-pay

## 2019-03-04 NOTE — Telephone Encounter (Signed)
Lm two messages to go over travel screening and to confirm appt  Pt hasn't returned call

## 2019-03-04 NOTE — Telephone Encounter (Signed)
Copied from [place] [id number]. Topic: General - Other >> Mar 04, 2019 11:59 AM Rainey Pines A wrote: Patient returned call to do screening for appt

## 2019-03-04 NOTE — Telephone Encounter (Signed)
Lm for pt to call back

## 2019-03-05 ENCOUNTER — Ambulatory Visit
Admission: RE | Admit: 2019-03-05 | Discharge: 2019-03-05 | Disposition: A | Discharge status: Home / Self Care | Payer: BC Managed Care – PPO | Source: Ambulatory Visit | Attending: Internal Medicine | Admitting: Internal Medicine

## 2019-03-05 ENCOUNTER — Other Ambulatory Visit: Payer: Self-pay

## 2019-03-05 ENCOUNTER — Encounter: Payer: Self-pay | Admitting: Internal Medicine

## 2019-03-05 ENCOUNTER — Ambulatory Visit (INDEPENDENT_AMBULATORY_CARE_PROVIDER_SITE_OTHER): Payer: BC Managed Care – PPO | Admitting: Internal Medicine

## 2019-03-05 VITALS — BP 128/72 | HR 87 | Temp 97.0°F | Resp 16 | Ht 64.0 in | Wt 151.0 lb

## 2019-03-05 DIAGNOSIS — Z0000 Encounter for general adult medical examination without abnormal findings: Secondary | ICD-10-CM | POA: Diagnosis not present

## 2019-03-05 DIAGNOSIS — Z1211 Encounter for screening for malignant neoplasm of colon: Secondary | ICD-10-CM

## 2019-03-05 DIAGNOSIS — I77811 Abdominal aortic ectasia: Secondary | ICD-10-CM | POA: Diagnosis not present

## 2019-03-05 DIAGNOSIS — Z9109 Other allergy status, other than to drugs and biological substances: Secondary | ICD-10-CM

## 2019-03-05 DIAGNOSIS — F439 Reaction to severe stress, unspecified: Secondary | ICD-10-CM

## 2019-03-05 DIAGNOSIS — E7800 Pure hypercholesterolemia, unspecified: Secondary | ICD-10-CM

## 2019-03-05 DIAGNOSIS — I10 Essential (primary) hypertension: Secondary | ICD-10-CM

## 2019-03-05 DIAGNOSIS — R059 Cough, unspecified: Secondary | ICD-10-CM | POA: Insufficient documentation

## 2019-03-05 DIAGNOSIS — R739 Hyperglycemia, unspecified: Secondary | ICD-10-CM

## 2019-03-05 DIAGNOSIS — R05 Cough: Secondary | ICD-10-CM

## 2019-03-05 DIAGNOSIS — E559 Vitamin D deficiency, unspecified: Secondary | ICD-10-CM

## 2019-03-05 NOTE — Assessment & Plan Note (Addendum)
Physical today 03/05/19.  PAP 07/04/17 - negative with negative HPV.  Mammogram 01/30/19 - Birads I.  Colonoscopy 06/2013 - normal.  Hemoccult cards given.

## 2019-03-05 NOTE — Patient Instructions (Signed)
nasacort nasal spray - 2 sprays each nostril one time per day.  Do this in the evening.    Allegra 180mg  - take one per day  pepcid 20mg  - take one tablet 30 minutes before your evening meal.

## 2019-03-05 NOTE — Progress Notes (Signed)
Patient ID: Tammy Daniels, female   DOB: [birth year], 58 y.o.   MRN: [id number]   Subjective:    Patient ID: Tammy Daniels, female    DOB: 1961/01/27, 58 y.o.   MRN: [id number]  HPI  Patient here for her physical exam.  She reports she is doing better.  Feels better.  On citalopram.  Feels this medication is doing well.  Trying to stay active.  No chest pain.  No sob.  No abdominal pain.  Bowels moving.  Talked about some cough.  Discussed possibility related to acid reflux and allergies.  Discussed taking pepcid regularly.  Discussed nasacort and allegra.  Colonoscopy 2015.  Recommended f/u in 10 years.  Discussed stool cards.  Blood pressure doing well.     Past Medical History:  Diagnosis Date  . Allergic rhinitis   . Elevated blood pressure   . Hypercholesterolemia    Past Surgical History:  Procedure Laterality Date  . DILATION AND CURETTAGE, DIAGNOSTIC / THERAPEUTIC  1992  . explaratory lap  1992  . HYSTEROSCOPY  1997   with cervical polyp removal   Family History  Problem Relation Age of Onset  . Hyperlipidemia Mother   . Hypertension Mother   . Heart disease Father        died 35 MI  . Hypertension Brother   . Hypothyroidism Sister    Social History   Socioeconomic History  . Marital status: Married    Spouse name: Not on file  . Number of children: 3  . Years of education: Not on file  . Highest education level: Not on file  Occupational History    Employer: [employer]  Social Needs  . Financial resource strain: Not on file  . Food insecurity    Worry: Not on file    Inability: Not on file  . Transportation needs    Medical: Not on file    Non-medical: Not on file  Tobacco Use  . Smoking status: Never Smoker  . Smokeless tobacco: Never Used  Substance and Sexual Activity  . Alcohol use: Yes    Alcohol/week: 0.0 standard drinks  . Drug use: No  . Sexual activity: Not on file  Lifestyle  . Physical activity    Days per week: Not on file   Minutes per session: Not on file  . Stress: Not on file  Relationships  . Social Herbalist on phone: Not on file    Gets together: Not on file    Attends religious service: Not on file    Active member of club or organization: Not on file    Attends meetings of clubs or organizations: Not on file    Relationship status: Not on file  Other Topics Concern  . Not on file  Social History Narrative  . Not on file    Outpatient Encounter Medications as of 03/05/2019  Medication Sig  . CALCIUM PO Take by mouth.  . citalopram (CELEXA) 10 MG tablet Take 1 tablet by mouth once daily  . levocetirizine (XYZAL) 5 MG tablet Take 1 tablet (5 mg total) by mouth daily.  Marland Kitchen lisinopril (ZESTRIL) 10 MG tablet Take 1 tablet by mouth once daily  . LORazepam (ATIVAN) 0.5 MG tablet Take 1/2 tablet q day prn  . rosuvastatin (CRESTOR) 5 MG tablet Take 1 tablet by mouth once daily   No facility-administered encounter medications on file as of 03/05/2019.    Review of Systems  Constitutional: Negative  for appetite change and unexpected weight change.  HENT: Negative for congestion and sinus pressure.   Eyes: Negative for pain and visual disturbance.  Respiratory: Negative for cough, chest tightness and shortness of breath.   Cardiovascular: Negative for chest pain, palpitations and leg swelling.  Gastrointestinal: Negative for abdominal pain, diarrhea, nausea and vomiting.  Genitourinary: Negative for difficulty urinating and dysuria.  Musculoskeletal: Negative for joint swelling and myalgias.  Skin: Negative for color change and rash.  Neurological: Negative for dizziness, light-headedness and headaches.  Hematological: Negative for adenopathy. Does not bruise/bleed easily.  Psychiatric/Behavioral: Negative for agitation and dysphoric mood.       Objective:    Physical Exam Constitutional:      General: She is not in acute distress.    Appearance: Normal appearance. She is  well-developed.  HENT:     Head: Normocephalic and atraumatic.     Right Ear: External ear normal.     Left Ear: External ear normal.  Eyes:     General: No scleral icterus.       Right eye: No discharge.        Left eye: No discharge.     Conjunctiva/sclera: Conjunctivae normal.  Neck:     Musculoskeletal: Neck supple. No muscular tenderness.     Thyroid: No thyromegaly.  Cardiovascular:     Rate and Rhythm: Normal rate and regular rhythm.  Pulmonary:     Effort: No tachypnea, accessory muscle usage or respiratory distress.     Breath sounds: Normal breath sounds. No decreased breath sounds or wheezing.  Chest:     Breasts:        Right: No inverted nipple, mass, nipple discharge or tenderness (no axillary adenopathy).        Left: No inverted nipple, mass, nipple discharge or tenderness (no axilarry adenopathy).  Abdominal:     General: Bowel sounds are normal.     Palpations: Abdomen is soft.     Tenderness: There is no abdominal tenderness.  Musculoskeletal:        General: No swelling or tenderness.  Lymphadenopathy:     Cervical: No cervical adenopathy.  Skin:    Findings: No erythema or rash.  Neurological:     Mental Status: She is alert and oriented to person, place, and time.  Psychiatric:        Mood and Affect: Mood normal.        Behavior: Behavior normal.     BP 128/72   Pulse 87   Temp (!) 97 F (36.1 C)   Resp 16   Ht 5' 4"  (1.626 m)   Wt 151 lb (68.5 kg)   SpO2 97%   BMI 25.92 kg/m  Wt Readings from Last 3 Encounters:  03/05/19 151 lb (68.5 kg)  12/25/18 152 lb 9.6 oz (69.2 kg)  04/04/18 152 lb 9.6 oz (69.2 kg)     Lab Results  Component Value Date   WBC 6.4 01/22/2019   HGB 14.9 01/22/2019   HCT 44.7 01/22/2019   PLT 253.0 01/22/2019   GLUCOSE 98 01/22/2019   CHOL 203 (H) 01/22/2019   TRIG 210.0 (H) 01/22/2019   HDL 54.70 01/22/2019   LDLDIRECT 128.0 01/22/2019   LDLCALC 99 04/03/2018   ALT 17 01/22/2019   AST 14 01/22/2019   NA  142 01/22/2019   K 4.2 01/22/2019   CL 105 01/22/2019   CREATININE 0.70 01/22/2019   BUN 15 01/22/2019   CO2 28 01/22/2019   TSH 2.36  04/03/2018   HGBA1C 6.1 01/22/2019    Mm 3d Screen Breast Bilateral  Result Date: 01/30/2019 CLINICAL DATA:  Screening. EXAM: DIGITAL SCREENING BILATERAL MAMMOGRAM WITH TOMO AND CAD COMPARISON:  Previous exam(s). ACR Breast Density Category c: The breast tissue is heterogeneously dense, which may obscure small masses. FINDINGS: There are no findings suspicious for malignancy. Images were processed with CAD. IMPRESSION: No mammographic evidence of malignancy. A result letter of this screening mammogram will be mailed directly to the patient. RECOMMENDATION: Screening mammogram in one year. (Code:SM-B-01Y) BI-RADS CATEGORY  1: Negative. Electronically Signed   By: Lajean Manes M.D.   On: 01/30/2019 12:50       Assessment & Plan:   Problem List Items Addressed This Visit    Abdominal aortic ectasia ([place])    Found on ultrasound.  Recommended f/u in 5 years.  Due 2022.        Cough    Cough as outlined.  Treat allergies with nasacort and allegra.  pepcid as directed.  Check cxr.  Schedule f/u soon to reassess.        Relevant Orders   DG Chest 2 View (Completed)   Environmental allergies    Treat with allegra and nasacort as directed.  Follow.        Essential hypertension    Blood pressure under good control.  Continue same medication regimen.  Follow pressures.  Follow metabolic panel.        Relevant Orders   TSH   Basic metabolic panel (future)   Health care maintenance    Physical today 03/05/19.  PAP 07/04/17 - negative with negative HPV.  Mammogram 01/30/19 - Birads I.  Colonoscopy 06/2013 - normal.  Hemoccult cards given.       Hypercholesterolemia    On crestor.  Low cholesterol diet and exercise.  Follow lipid panel and liver function tests.        Relevant Orders   Hepatic function panel   Lipid panel   Hyperglycemia    Low carb  diet and exercise.  Follow met b and a1c.      Relevant Orders   Hemoglobin A1c   Stress    On citalopram.  Doing well.  Follow.        Vitamin D deficiency    Follow vitamin D level.       Relevant Orders   VITAMIN D 25 Hydroxy (Vit-D Deficiency, Fractures)    Other Visit Diagnoses    Colon cancer screening    -  Primary   Relevant Orders   Fecal occult blood, imunochemical       Einar Pheasant, MD

## 2019-03-09 ENCOUNTER — Encounter: Payer: Self-pay | Admitting: Internal Medicine

## 2019-03-09 NOTE — Assessment & Plan Note (Signed)
Blood pressure under good control.  Continue same medication regimen.  Follow pressures.  Follow metabolic panel.   

## 2019-03-09 NOTE — Assessment & Plan Note (Signed)
Low carb diet and exercise.  Follow met b and a1c.

## 2019-03-09 NOTE — Assessment & Plan Note (Signed)
On crestor.  Low cholesterol diet and exercise.  Follow lipid panel and liver function tests.   

## 2019-03-09 NOTE — Assessment & Plan Note (Signed)
On citalopram.  Doing well.  Follow.   

## 2019-03-09 NOTE — Assessment & Plan Note (Signed)
Follow vitamin D level.  

## 2019-03-09 NOTE — Assessment & Plan Note (Signed)
Found on ultrasound.  Recommended f/u in 5 years.  Due 2022.   

## 2019-03-09 NOTE — Assessment & Plan Note (Signed)
Cough as outlined.  Treat allergies with nasacort and allegra.  pepcid as directed.  Check cxr.  Schedule f/u soon to reassess.

## 2019-03-09 NOTE — Assessment & Plan Note (Signed)
Treat with allegra and nasacort as directed.  Follow.

## 2019-05-15 ENCOUNTER — Other Ambulatory Visit: Payer: Self-pay | Admitting: Internal Medicine

## 2019-05-27 DIAGNOSIS — A084 Viral intestinal infection, unspecified: Secondary | ICD-10-CM | POA: Diagnosis not present

## 2019-05-27 DIAGNOSIS — Z20822 Contact with and (suspected) exposure to covid-19: Secondary | ICD-10-CM | POA: Diagnosis not present

## 2019-06-05 ENCOUNTER — Other Ambulatory Visit: Payer: Self-pay

## 2019-06-05 ENCOUNTER — Other Ambulatory Visit (INDEPENDENT_AMBULATORY_CARE_PROVIDER_SITE_OTHER): Payer: BC Managed Care – PPO

## 2019-06-05 DIAGNOSIS — I10 Essential (primary) hypertension: Secondary | ICD-10-CM | POA: Diagnosis not present

## 2019-06-05 DIAGNOSIS — E559 Vitamin D deficiency, unspecified: Secondary | ICD-10-CM | POA: Diagnosis not present

## 2019-06-05 DIAGNOSIS — R739 Hyperglycemia, unspecified: Secondary | ICD-10-CM

## 2019-06-05 DIAGNOSIS — E7800 Pure hypercholesterolemia, unspecified: Secondary | ICD-10-CM | POA: Diagnosis not present

## 2019-06-05 LAB — LIPID PANEL
Cholesterol: 227 mg/dL — ABNORMAL HIGH (ref 0–200)
HDL: 54.8 mg/dL (ref >39.00)
LDL Cholesterol: 138 mg/dL — ABNORMAL HIGH (ref 0–99)
NonHDL: 171.92
Total CHOL/HDL Ratio: 4
Triglycerides: 171 mg/dL — ABNORMAL HIGH (ref 0.0–149.0)
VLDL: 34.2 mg/dL (ref 0.0–40.0)

## 2019-06-05 LAB — HEPATIC FUNCTION PANEL
ALT: 21 U/L (ref 0–35)
AST: 18 U/L (ref 0–37)
Albumin: 4.3 g/dL (ref 3.5–5.2)
Alkaline Phosphatase: 85 U/L (ref 39–117)
Bilirubin, Direct: 0.1 mg/dL (ref 0.0–0.3)
Total Bilirubin: 0.6 mg/dL (ref 0.2–1.2)
Total Protein: 7.2 g/dL (ref 6.0–8.3)

## 2019-06-05 LAB — TSH: TSH: 3.75 u[IU]/mL (ref 0.35–4.50)

## 2019-06-05 LAB — BASIC METABOLIC PANEL
BUN: 18 mg/dL (ref 6–23)
CO2: 30 mEq/L (ref 19–32)
Calcium: 9.3 mg/dL (ref 8.4–10.5)
Chloride: 106 mEq/L (ref 96–112)
Creatinine, Ser: 0.73 mg/dL (ref 0.40–1.20)
GFR: 81.38 mL/min (ref >60.00)
Glucose, Bld: 102 mg/dL — ABNORMAL HIGH (ref 70–99)
Potassium: 4 mEq/L (ref 3.5–5.1)
Sodium: 141 mEq/L (ref 135–145)

## 2019-06-05 LAB — HEMOGLOBIN A1C: Hgb A1c MFr Bld: 6.2 % (ref 4.6–6.5)

## 2019-06-05 LAB — VITAMIN D 25 HYDROXY (VIT D DEFICIENCY, FRACTURES): VITD: 23.96 ng/mL — ABNORMAL LOW (ref 30.00–100.00)

## 2019-06-11 ENCOUNTER — Ambulatory Visit (INDEPENDENT_AMBULATORY_CARE_PROVIDER_SITE_OTHER): Payer: BC Managed Care – PPO | Admitting: Internal Medicine

## 2019-06-11 ENCOUNTER — Other Ambulatory Visit: Payer: Self-pay

## 2019-06-11 DIAGNOSIS — R197 Diarrhea, unspecified: Secondary | ICD-10-CM

## 2019-06-11 DIAGNOSIS — I10 Essential (primary) hypertension: Secondary | ICD-10-CM | POA: Diagnosis not present

## 2019-06-11 DIAGNOSIS — I77811 Abdominal aortic ectasia: Secondary | ICD-10-CM | POA: Diagnosis not present

## 2019-06-11 DIAGNOSIS — Z9109 Other allergy status, other than to drugs and biological substances: Secondary | ICD-10-CM

## 2019-06-11 DIAGNOSIS — E7800 Pure hypercholesterolemia, unspecified: Secondary | ICD-10-CM

## 2019-06-11 DIAGNOSIS — R739 Hyperglycemia, unspecified: Secondary | ICD-10-CM

## 2019-06-11 MED ORDER — HYDROCORT-PRAMOXINE (PERIANAL) 2.5-1 % EX CREA: 1.0000 "application " | TOPICAL_CREAM | Freq: Two times a day (BID) | CUTANEOUS | 0 refills | Status: DC

## 2019-06-11 NOTE — Progress Notes (Signed)
Patient ID: Tammy Daniels, female   DOB: 1960/11/17, 59 y.o.   MRN: [id number]   Virtual Visit via video Note  This visit type was conducted due to national recommendations for restrictions regarding the COVID-19 pandemic (e.g. social distancing).  This format is felt to be most appropriate for this patient at this time.  All issues noted in this document were discussed and addressed.  No physical exam was performed (except for noted visual exam findings with Video Visits).   I connected with Tammy Daniels by a video enabled telemedicine application and verified that I am speaking with the correct person using two identifiers. Location patient: home Location provider: work Persons participating in the virtual visit: patient, provider  The limitations, risks, security and privacy concerns of performing an evaluation and management service by video and the availability of in person appointments have been discussed.   The patient expressed understanding and agreed to proceed.   Reason for visit: scheduled follow up.    HPI: She reports she is doing relatively well.  Has had problems with diarrhea.  When started, emesis x 1.  No vomiting since.  Abdomen felt distended.  Did irritate a hemorrhoid.  Request medication to help with hemorrhoid.  No bleeding.  Feeling better.  No fever.  Viral headache.  No chest pain or sob reported.  covid negative.  [place] working - controlling allergies.  Omeprazole - controlling upper symptoms.  Diarrhea better.    ROS: See pertinent positives and negatives per HPI.  Past Medical History:  Diagnosis Date  . Allergic rhinitis   . Elevated blood pressure   . Hypercholesterolemia     Past Surgical History:  Procedure Laterality Date  . DILATION AND CURETTAGE, DIAGNOSTIC / THERAPEUTIC  1992  . explaratory lap  1992  . HYSTEROSCOPY  1997   with cervical polyp removal    Family History  Problem Relation Age of Onset  . Hyperlipidemia Mother   .  Hypertension Mother   . Heart disease Father        died 56 MI  . Hypertension Brother   . Hypothyroidism Sister     SOCIAL HX: reviewed.    Current Outpatient Medications:  .  CALCIUM PO, Take by mouth., Disp: , Rfl:  .  citalopram (CELEXA) 10 MG tablet, Take 1 tablet by mouth once daily, Disp: 90 tablet, Rfl: 0 .  hydrocortisone-pramoxine (ANALPRAM HC) 2.5-1 % rectal cream, Place 1 application rectally in the morning and at bedtime., Disp: 30 g, Rfl: 0 .  levocetirizine (XYZAL) 5 MG tablet, Take 1 tablet (5 mg total) by mouth daily., Disp: 90 tablet, Rfl: 1 .  lisinopril (ZESTRIL) 10 MG tablet, Take 1 tablet by mouth once daily, Disp: 90 tablet, Rfl: 0 .  LORazepam (ATIVAN) 0.5 MG tablet, Take 1/2 tablet q day prn, Disp: 20 tablet, Rfl: 0 .  rosuvastatin (CRESTOR) 5 MG tablet, Take 1 tablet by mouth once daily, Disp: 90 tablet, Rfl: 0  EXAM:  GENERAL: alert, oriented, appears well and in no acute distress  HEENT: atraumatic, conjunttiva clear, no obvious abnormalities on inspection of external nose and ears  NECK: normal movements of the head and neck  LUNGS: on inspection no signs of respiratory distress, breathing rate appears normal, no obvious gross SOB, gasping or wheezing  CV: no obvious cyanosis  PSYCH/NEURO: pleasant and cooperative, no obvious depression or anxiety, speech and thought processing grossly intact  ASSESSMENT AND PLAN:  Discussed the following assessment and plan:  Essential  hypertension Blood pressure under good control.  Continue same medication regimen.  Follow pressures.  Follow metabolic panel.    Abdominal aortic ectasia (HCC) Found on ultrasound.  Recommended f/u in 5 years.  Due 2022.    Environmental allergies [place] working well.  Controlling symptoms.    Hypercholesterolemia On crestor.  Low cholesterol diet and exercise.  Follow lipid panel and liver function tests.    Hyperglycemia Low carb diet and exercise.  Follow met b and  a1c.   Diarrhea Diarrhea recently.  Better now.  Eating.  No nausea or vomiting.  Hemorrhoid flare.  analpram HC as directed.  Follow.     Orders Placed This Encounter  Procedures  . Hemoglobin A1c    Standing Status:   Future    Standing Expiration Date:   06/16/2020  . Hepatic function panel    Standing Status:   Future    Standing Expiration Date:   06/16/2020  . Lipid panel    Standing Status:   Future    Standing Expiration Date:   06/16/2020  . Basic metabolic panel    Standing Status:   Future    Standing Expiration Date:   06/16/2020    Meds ordered this encounter  Medications  . hydrocortisone-pramoxine (ANALPRAM HC) 2.5-1 % rectal cream    Sig: Place 1 application rectally in the morning and at bedtime.    Dispense:  30 g    Refill:  0     I discussed the assessment and treatment plan with the patient. The patient was provided an opportunity to ask questions and all were answered. The patient agreed with the plan and demonstrated an understanding of the instructions.   The patient was advised to call back or seek an in-person evaluation if the symptoms worsen or if the condition fails to improve as anticipated.   Einar Pheasant, MD

## 2019-06-17 ENCOUNTER — Encounter: Payer: Self-pay | Admitting: Internal Medicine

## 2019-06-17 DIAGNOSIS — R197 Diarrhea, unspecified: Secondary | ICD-10-CM | POA: Insufficient documentation

## 2019-06-17 NOTE — Assessment & Plan Note (Signed)
Blood pressure under good control.  Continue same medication regimen.  Follow pressures.  Follow metabolic panel.   

## 2019-06-17 NOTE — Assessment & Plan Note (Signed)
[place] working well.  Controlling symptoms.

## 2019-06-17 NOTE — Assessment & Plan Note (Signed)
Diarrhea recently.  Better now.  Eating.  No nausea or vomiting.  Hemorrhoid flare.  analpram HC as directed.  Follow.

## 2019-06-17 NOTE — Assessment & Plan Note (Signed)
Low carb diet and exercise.  Follow met b and a1c.  

## 2019-06-17 NOTE — Assessment & Plan Note (Signed)
On crestor.  Low cholesterol diet and exercise.  Follow lipid panel and liver function tests.   

## 2019-06-17 NOTE — Assessment & Plan Note (Signed)
Found on ultrasound.  Recommended f/u in 5 years.  Due 2022.   

## 2019-07-03 ENCOUNTER — Telehealth: Payer: Self-pay | Admitting: Internal Medicine

## 2019-07-03 MED ORDER — CITALOPRAM HYDROBROMIDE 10 MG PO TABS: 10.0000 mg | ORAL_TABLET | Freq: Every day | ORAL | 0 refills | Status: DC

## 2019-07-03 NOTE — Telephone Encounter (Signed)
Pt would like 3 month supply on citalopram (CELEXA) 10 MG tablet sent [facility]. Pt only has a few left

## 2019-07-04 ENCOUNTER — Telehealth: Payer: Self-pay | Admitting: Internal Medicine

## 2019-07-04 MED ORDER — CITALOPRAM HYDROBROMIDE 10 MG PO TABS: 10.0000 mg | ORAL_TABLET | Freq: Every day | ORAL | 0 refills | Status: DC

## 2019-07-04 NOTE — Telephone Encounter (Signed)
Please send citalopram (CELEXA) 10 MG tablet to [facility]. Pt states that it was sent to [facility].

## 2019-07-04 NOTE — Telephone Encounter (Signed)
err

## 2019-07-04 NOTE — Addendum Note (Signed)
Addended by: Elise Benne T on: 07/04/2019 12:55 PM   Modules accepted: Orders

## 2019-07-11 ENCOUNTER — Other Ambulatory Visit: Payer: Self-pay

## 2019-07-11 ENCOUNTER — Telehealth: Payer: Self-pay | Admitting: Internal Medicine

## 2019-07-11 MED ORDER — ROSUVASTATIN CALCIUM 5 MG PO TABS: 5.0000 mg | ORAL_TABLET | Freq: Every day | ORAL | 0 refills | Status: DC

## 2019-07-11 NOTE — Telephone Encounter (Signed)
Pt needs a refill on rosuvastatin (CRESTOR) 5 MG tablet sent to [facility]

## 2019-07-11 NOTE — Telephone Encounter (Signed)
Rx refilled.

## 2019-08-29 ENCOUNTER — Telehealth: Payer: Self-pay | Admitting: Internal Medicine

## 2019-08-29 DIAGNOSIS — Z20822 Contact with and (suspected) exposure to covid-19: Secondary | ICD-10-CM

## 2019-08-29 MED ORDER — LISINOPRIL 10 MG PO TABS: 10.0000 mg | ORAL_TABLET | Freq: Every day | ORAL | 1 refills | Status: DC

## 2019-08-29 NOTE — Telephone Encounter (Signed)
Pt needs a refill on lisinopril (ZESTRIL) 10 MG tablet sent to [facility]  Pt would like a 90 day supply

## 2019-08-29 NOTE — Telephone Encounter (Signed)
Pt wants to have an antibody test for covid 19 sent to [facility]

## 2019-08-30 NOTE — Telephone Encounter (Signed)
Lab test ordered for [facility]. [place] and [place] test done.

## 2019-08-30 NOTE — Addendum Note (Signed)
Addended by: Larry Sierras on: 08/30/2019 09:40 AM   Modules accepted: Orders

## 2019-09-04 DIAGNOSIS — Z20822 Contact with and (suspected) exposure to covid-19: Secondary | ICD-10-CM | POA: Diagnosis not present

## 2019-09-05 LAB — SAR COV2 SEROLOGY (COVID19)AB(IGG),IA: DiaSorin SARS-CoV-2 Ab, IgG: POSITIVE

## 2019-10-09 ENCOUNTER — Other Ambulatory Visit: Payer: BC Managed Care – PPO

## 2019-10-16 ENCOUNTER — Ambulatory Visit: Payer: BC Managed Care – PPO | Admitting: Internal Medicine

## 2019-11-02 ENCOUNTER — Other Ambulatory Visit: Payer: Self-pay | Admitting: Internal Medicine

## 2019-11-16 ENCOUNTER — Other Ambulatory Visit: Payer: Self-pay | Admitting: Internal Medicine

## 2019-11-27 ENCOUNTER — Other Ambulatory Visit: Payer: Self-pay

## 2019-11-27 ENCOUNTER — Other Ambulatory Visit (INDEPENDENT_AMBULATORY_CARE_PROVIDER_SITE_OTHER): Payer: BC Managed Care – PPO

## 2019-11-27 DIAGNOSIS — I10 Essential (primary) hypertension: Secondary | ICD-10-CM

## 2019-11-27 DIAGNOSIS — E7800 Pure hypercholesterolemia, unspecified: Secondary | ICD-10-CM

## 2019-11-27 DIAGNOSIS — R739 Hyperglycemia, unspecified: Secondary | ICD-10-CM | POA: Diagnosis not present

## 2019-11-27 LAB — LIPID PANEL
Cholesterol: 165 mg/dL (ref 0–200)
HDL: 47.7 mg/dL (ref >39.00)
LDL Cholesterol: 89 mg/dL (ref 0–99)
NonHDL: 117.41
Total CHOL/HDL Ratio: 3
Triglycerides: 141 mg/dL (ref 0.0–149.0)
VLDL: 28.2 mg/dL (ref 0.0–40.0)

## 2019-11-27 LAB — HEPATIC FUNCTION PANEL
ALT: 21 U/L (ref 0–35)
AST: 18 U/L (ref 0–37)
Albumin: 4.2 g/dL (ref 3.5–5.2)
Alkaline Phosphatase: 85 U/L (ref 39–117)
Bilirubin, Direct: 0 mg/dL (ref 0.0–0.3)
Total Bilirubin: 0.5 mg/dL (ref 0.2–1.2)
Total Protein: 6.9 g/dL (ref 6.0–8.3)

## 2019-11-27 LAB — BASIC METABOLIC PANEL
BUN: 17 mg/dL (ref 6–23)
CO2: 30 mEq/L (ref 19–32)
Calcium: 9 mg/dL (ref 8.4–10.5)
Chloride: 107 mEq/L (ref 96–112)
Creatinine, Ser: 0.73 mg/dL (ref 0.40–1.20)
GFR: 81.25 mL/min (ref >60.00)
Glucose, Bld: 90 mg/dL (ref 70–99)
Potassium: 3.9 mEq/L (ref 3.5–5.1)
Sodium: 141 mEq/L (ref 135–145)

## 2019-11-27 LAB — HEMOGLOBIN A1C: Hgb A1c MFr Bld: 6.1 % (ref 4.6–6.5)

## 2019-12-02 ENCOUNTER — Telehealth: Payer: Self-pay | Admitting: Internal Medicine

## 2019-12-02 NOTE — Telephone Encounter (Signed)
FYI

## 2019-12-02 NOTE — Telephone Encounter (Signed)
Pt called in stated that her daughter has covid changed appt to virtual phone

## 2019-12-03 ENCOUNTER — Telehealth (INDEPENDENT_AMBULATORY_CARE_PROVIDER_SITE_OTHER): Payer: BC Managed Care – PPO | Admitting: Internal Medicine

## 2019-12-03 ENCOUNTER — Encounter: Payer: Self-pay | Admitting: Internal Medicine

## 2019-12-03 DIAGNOSIS — E7800 Pure hypercholesterolemia, unspecified: Secondary | ICD-10-CM | POA: Diagnosis not present

## 2019-12-03 DIAGNOSIS — R739 Hyperglycemia, unspecified: Secondary | ICD-10-CM

## 2019-12-03 DIAGNOSIS — I10 Essential (primary) hypertension: Secondary | ICD-10-CM | POA: Diagnosis not present

## 2019-12-03 DIAGNOSIS — I77811 Abdominal aortic ectasia: Secondary | ICD-10-CM

## 2019-12-03 DIAGNOSIS — Z20822 Contact with and (suspected) exposure to covid-19: Secondary | ICD-10-CM

## 2019-12-03 DIAGNOSIS — F439 Reaction to severe stress, unspecified: Secondary | ICD-10-CM | POA: Diagnosis not present

## 2019-12-03 NOTE — Progress Notes (Signed)
Patient ID: Tammy Daniels, female   DOB: 08-04-1960, 59 y.o.   MRN: [id number]   Virtual Visit via telephone Note  This visit type was conducted due to national recommendations for restrictions regarding the COVID-19 pandemic (e.g. social distancing).  This format is felt to be most appropriate for this patient at this time.  All issues noted in this document were discussed and addressed.  No physical exam was performed (except for noted visual exam findings with Video Visits).   I connected with Tammy Daniels by telephone and verified that I am speaking with the correct person using two identifiers. Location patient: home Location provider: work Persons participating in the virtual visit: patient, provider  The limitations, risks, security and privacy concerns of performing an evaluation and management service by telephone and the availability of in person appointments have been discussed.  It has also been discussed with the patient that there may be a patient responsible charge related to this service. The patient expressed understanding and agreed to proceed.   Reason for visit: scheduled follow pu.    HPI: She reports she has been doing relatively well.  Her daughter was diagnosed with covid.  Lives with her.  Her daughter developed low grade fever and loss of smell.  Some cough and back ache.  Symptoms started 8 days ago. She is doing well.  No fever.  No chest congestion or cough.  Is in quarantine.  Has had vaccine.  Eating.  Stays active.  No chest pain or sob.  Blood pressures doing well:  114/78 and 123/84.  Discussed recent labs.    ROS: See pertinent positives and negatives per HPI.  Past Medical History:  Diagnosis Date  . Allergic rhinitis   . Elevated blood pressure   . Hypercholesterolemia     Past Surgical History:  Procedure Laterality Date  . DILATION AND CURETTAGE, DIAGNOSTIC / THERAPEUTIC  1992  . explaratory lap  1992  . HYSTEROSCOPY  1997   with cervical polyp  removal    Family History  Problem Relation Age of Onset  . Hyperlipidemia Mother   . Hypertension Mother   . Heart disease Father        died 71 MI  . Hypertension Brother   . Hypothyroidism Sister     SOCIAL HX: reviewed.    Current Outpatient Medications:  .  CALCIUM PO, Take by mouth., Disp: , Rfl:  .  citalopram (CELEXA) 10 MG tablet, TAKE ONE TABLET (10 MG) BY MOUTH EVERY DAY, Disp: 90 tablet, Rfl: 0 .  hydrocortisone-pramoxine (ANALPRAM HC) 2.5-1 % rectal cream, Place 1 application rectally in the morning and at bedtime., Disp: 30 g, Rfl: 0 .  lisinopril (ZESTRIL) 10 MG tablet, Take 1 tablet (10 mg total) by mouth daily., Disp: 90 tablet, Rfl: 1 .  LORazepam (ATIVAN) 0.5 MG tablet, Take 1/2 tablet q day prn, Disp: 20 tablet, Rfl: 0 .  rosuvastatin (CRESTOR) 5 MG tablet, TAKE 1 TABLET BY MOUTH DAILY, Disp: 90 tablet, Rfl: 0  EXAM:  VITALS per patient if applicable: 376/28 and 315/17  GENERAL: alert.  Sounds to be in no acute distress.  Answering questions appropriately.   PSYCH/NEURO: pleasant and cooperative, no obvious depression or anxiety, speech and thought processing grossly intact  ASSESSMENT AND PLAN:  Discussed the following assessment and plan:  Stress On citalopram and doing well.  Follow.    Hyperglycemia Low carb diet and exercise.  Follow met b and a1c.  Lab Results  Component  Value Date   HGBA1C 6.1 11/27/2019    Hypercholesterolemia On crestor.  Low cholesterol diet and exercise.  Follow lipid panel and liver function tests.   Lab Results  Component Value Date   CHOL 165 11/27/2019   HDL 47.70 11/27/2019   LDLCALC 89 11/27/2019   LDLDIRECT 128.0 01/22/2019   TRIG 141.0 11/27/2019   CHOLHDL 3 11/27/2019    Essential hypertension Blood pressure doing well.  Continue lisinopril.  Follow pressures.  Follow metabolic panel.   Abdominal aortic ectasia (HCC) Found on ultrasound.  Recommended f/u in 5 years.  Due 2022.    Exposure to  COVID-19 virus Daughter diagnosed with covid.  Daughter's symptoms began 8 days ago.  Ms Shira has had her vaccine.  She is currently without symptoms.  Is in quarantine.  Declines covid testing.  Follow.  Notify me if symptoms develop, etc.    Orders Placed This Encounter  Procedures  . CBC with Differential/Platelet    Standing Status:   Future    Standing Expiration Date:   12/14/2020  . Hemoglobin A1c    Standing Status:   Future    Standing Expiration Date:   12/14/2020  . Hepatic function panel    Standing Status:   Future    Standing Expiration Date:   12/14/2020  . Lipid panel    Standing Status:   Future    Standing Expiration Date:   12/14/2020  . Basic metabolic panel    Standing Status:   Future    Standing Expiration Date:   12/14/2020    I discussed the assessment and treatment plan with the patient. The patient was provided an opportunity to ask questions and all were answered. The patient agreed with the plan and demonstrated an understanding of the instructions.   The patient was advised to call back or seek an in-person evaluation if the symptoms worsen or if the condition fails to improve as anticipated.  I provided 22 minutes of non-face-to-face time during this encounter.   Einar Pheasant, MD

## 2019-12-08 DIAGNOSIS — Z20828 Contact with and (suspected) exposure to other viral communicable diseases: Secondary | ICD-10-CM | POA: Diagnosis not present

## 2019-12-09 ENCOUNTER — Other Ambulatory Visit: Payer: BC Managed Care – PPO

## 2019-12-15 ENCOUNTER — Encounter: Payer: Self-pay | Admitting: Internal Medicine

## 2019-12-15 DIAGNOSIS — Z20822 Contact with and (suspected) exposure to covid-19: Secondary | ICD-10-CM | POA: Insufficient documentation

## 2019-12-15 NOTE — Assessment & Plan Note (Signed)
On citalopram and doing well.  Follow.   

## 2019-12-15 NOTE — Assessment & Plan Note (Signed)
On crestor.  Low cholesterol diet and exercise.  Follow lipid panel and liver function tests.   Lab Results  Component Value Date   CHOL 165 11/27/2019   HDL 47.70 11/27/2019   LDLCALC 89 11/27/2019   LDLDIRECT 128.0 01/22/2019   TRIG 141.0 11/27/2019   CHOLHDL 3 11/27/2019

## 2019-12-15 NOTE — Assessment & Plan Note (Signed)
Found on ultrasound.  Recommended f/u in 5 years.  Due 2022.

## 2019-12-15 NOTE — Assessment & Plan Note (Signed)
Low carb diet and exercise.  Follow met b and a1c.  Lab Results  Component Value Date   HGBA1C 6.1 11/27/2019

## 2019-12-15 NOTE — Assessment & Plan Note (Signed)
Daughter diagnosed with covid.  Daughter's symptoms began 8 days ago.  Tammy Daniels has had her vaccine.  She is currently without symptoms.  Is in quarantine.  Declines covid testing.  Follow.  Notify me if symptoms develop, etc.

## 2019-12-15 NOTE — Assessment & Plan Note (Signed)
Blood pressure doing well.  Continue lisinopril.  Follow pressures.  Follow metabolic panel.  

## 2019-12-28 ENCOUNTER — Other Ambulatory Visit: Payer: Self-pay | Admitting: Internal Medicine

## 2020-02-14 ENCOUNTER — Other Ambulatory Visit: Payer: Self-pay | Admitting: Internal Medicine

## 2020-05-20 ENCOUNTER — Encounter: Payer: Self-pay | Admitting: Internal Medicine

## 2020-06-03 ENCOUNTER — Other Ambulatory Visit (INDEPENDENT_AMBULATORY_CARE_PROVIDER_SITE_OTHER): Payer: BC Managed Care – PPO

## 2020-06-03 ENCOUNTER — Other Ambulatory Visit: Payer: Self-pay

## 2020-06-03 DIAGNOSIS — E7800 Pure hypercholesterolemia, unspecified: Secondary | ICD-10-CM | POA: Diagnosis not present

## 2020-06-03 DIAGNOSIS — R739 Hyperglycemia, unspecified: Secondary | ICD-10-CM

## 2020-06-03 DIAGNOSIS — I10 Essential (primary) hypertension: Secondary | ICD-10-CM | POA: Diagnosis not present

## 2020-06-03 LAB — BASIC METABOLIC PANEL
BUN: 15 mg/dL (ref 6–23)
CO2: 32 mEq/L (ref 19–32)
Calcium: 9.4 mg/dL (ref 8.4–10.5)
Chloride: 104 mEq/L (ref 96–112)
Creatinine, Ser: 0.71 mg/dL (ref 0.40–1.20)
GFR: 92.19 mL/min (ref >60.00)
Glucose, Bld: 96 mg/dL (ref 70–99)
Potassium: 4.6 mEq/L (ref 3.5–5.1)
Sodium: 141 mEq/L (ref 135–145)

## 2020-06-03 LAB — CBC WITH DIFFERENTIAL/PLATELET
Basophils Absolute: 0 10*3/uL (ref 0.0–0.1)
Basophils Relative: 0.6 % (ref 0.0–3.0)
Eosinophils Absolute: 0.5 10*3/uL (ref 0.0–0.7)
Eosinophils Relative: 7 % — ABNORMAL HIGH (ref 0.0–5.0)
HCT: 45.3 % (ref 36.0–46.0)
Hemoglobin: 15.2 g/dL — ABNORMAL HIGH (ref 12.0–15.0)
Lymphocytes Relative: 33.2 % (ref 12.0–46.0)
Lymphs Abs: 2.5 10*3/uL (ref 0.7–4.0)
MCHC: 33.6 g/dL (ref 30.0–36.0)
MCV: 89 fl (ref 78.0–100.0)
Monocytes Absolute: 0.6 10*3/uL (ref 0.1–1.0)
Monocytes Relative: 8.1 % (ref 3.0–12.0)
Neutro Abs: 3.8 10*3/uL (ref 1.4–7.7)
Neutrophils Relative %: 51.1 % (ref 43.0–77.0)
Platelets: 267 10*3/uL (ref 150.0–400.0)
RBC: 5.09 Mil/uL (ref 3.87–5.11)
RDW: 13 % (ref 11.5–15.5)
WBC: 7.4 10*3/uL (ref 4.0–10.5)

## 2020-06-03 LAB — LIPID PANEL
Cholesterol: 190 mg/dL (ref 0–200)
HDL: 48.9 mg/dL (ref >39.00)
LDL Cholesterol: 108 mg/dL — ABNORMAL HIGH (ref 0–99)
NonHDL: 140.99
Total CHOL/HDL Ratio: 4
Triglycerides: 165 mg/dL — ABNORMAL HIGH (ref 0.0–149.0)
VLDL: 33 mg/dL (ref 0.0–40.0)

## 2020-06-03 LAB — HEPATIC FUNCTION PANEL
ALT: 19 U/L (ref 0–35)
AST: 18 U/L (ref 0–37)
Albumin: 4.3 g/dL (ref 3.5–5.2)
Alkaline Phosphatase: 89 U/L (ref 39–117)
Bilirubin, Direct: 0.1 mg/dL (ref 0.0–0.3)
Total Bilirubin: 0.6 mg/dL (ref 0.2–1.2)
Total Protein: 6.9 g/dL (ref 6.0–8.3)

## 2020-06-03 LAB — HEMOGLOBIN A1C: Hgb A1c MFr Bld: 6.1 % (ref 4.6–6.5)

## 2020-06-10 ENCOUNTER — Other Ambulatory Visit: Payer: Self-pay

## 2020-06-10 ENCOUNTER — Other Ambulatory Visit (HOSPITAL_COMMUNITY)
Admission: RE | Admit: 2020-06-10 | Discharge: 2020-06-10 | Disposition: A | Discharge status: Home / Self Care | Payer: BC Managed Care – PPO | Source: Ambulatory Visit | Attending: Internal Medicine | Admitting: Internal Medicine

## 2020-06-10 ENCOUNTER — Ambulatory Visit (INDEPENDENT_AMBULATORY_CARE_PROVIDER_SITE_OTHER): Payer: BC Managed Care – PPO | Admitting: Internal Medicine

## 2020-06-10 VITALS — BP 110/70 | HR 66 | Temp 98.3°F | Resp 16 | Ht 64.0 in | Wt 154.0 lb

## 2020-06-10 DIAGNOSIS — Z0000 Encounter for general adult medical examination without abnormal findings: Secondary | ICD-10-CM | POA: Diagnosis not present

## 2020-06-10 DIAGNOSIS — F439 Reaction to severe stress, unspecified: Secondary | ICD-10-CM

## 2020-06-10 DIAGNOSIS — I77811 Abdominal aortic ectasia: Secondary | ICD-10-CM

## 2020-06-10 DIAGNOSIS — Z1231 Encounter for screening mammogram for malignant neoplasm of breast: Secondary | ICD-10-CM | POA: Diagnosis not present

## 2020-06-10 DIAGNOSIS — Z124 Encounter for screening for malignant neoplasm of cervix: Secondary | ICD-10-CM | POA: Diagnosis not present

## 2020-06-10 DIAGNOSIS — I10 Essential (primary) hypertension: Secondary | ICD-10-CM

## 2020-06-10 DIAGNOSIS — E7800 Pure hypercholesterolemia, unspecified: Secondary | ICD-10-CM

## 2020-06-10 DIAGNOSIS — R739 Hyperglycemia, unspecified: Secondary | ICD-10-CM

## 2020-06-10 MED ORDER — LORAZEPAM 0.5 MG PO TABS
ORAL_TABLET | ORAL | 0 refills | Status: DC
Start: 2020-06-10 — End: 2022-07-25

## 2020-06-10 NOTE — Progress Notes (Signed)
Patient ID: Tammy Daniels, female   DOB: 08/20/1960, 60 y.o.   MRN: [id number]   Subjective:    Patient ID: Tammy Daniels, female    DOB: 1960-11-04, 60 y.o.   MRN: [id number]  HPI This visit occurred during the SARS-CoV-2 public health emergency.  Safety protocols were in place, including screening questions prior to the visit, additional usage of staff PPE, and extensive cleaning of exam room while observing appropriate contact time as indicated for disinfecting solutions.  Patient here for her physical exam.  She reports she is doing relatively well.  Increased stress.  Discussed.  Overall appears to be handling things well.  On citalopram.  Has lorazepam to take prn. Tries to stay active.  No chest pain or sob with increased activity or exertion.  No acid reflux.  No abdominal pain or cramping.  Discussed due f/u aortic ultrasound.  Wants to hold.     Past Medical History:  Diagnosis Date  . Allergic rhinitis   . Elevated blood pressure   . Hypercholesterolemia    Past Surgical History:  Procedure Laterality Date  . DILATION AND CURETTAGE, DIAGNOSTIC / THERAPEUTIC  1992  . explaratory lap  1992  . HYSTEROSCOPY  1997   with cervical polyp removal   Family History  Problem Relation Age of Onset  . Hyperlipidemia Mother   . Hypertension Mother   . Heart disease Father        died 18 MI  . Hypertension Brother   . Hypothyroidism Sister    Social History   Socioeconomic History  . Marital status: Married    Spouse name: Not on file  . Number of children: 3  . Years of education: Not on file  . Highest education level: Not on file  Occupational History    Employer: [employer]  Tobacco Use  . Smoking status: Never Smoker  . Smokeless tobacco: Never Used  Substance and Sexual Activity  . Alcohol use: Yes    Alcohol/week: 0.0 standard drinks  . Drug use: No  . Sexual activity: Not on file  Other Topics Concern  . Not on file  Social History Narrative  . Not on  file   Social Determinants of Health   Financial Resource Strain: Not on file  Food Insecurity: Not on file  Transportation Needs: Not on file  Physical Activity: Not on file  Stress: Not on file  Social Connections: Not on file    Outpatient Encounter Medications as of 06/10/2020  Medication Sig  . CALCIUM PO Take by mouth.  . citalopram (CELEXA) 10 MG tablet TAKE ONE TABLET (10 MG) BY MOUTH EVERY DAY  . hydrocortisone-pramoxine (ANALPRAM HC) 2.5-1 % rectal cream Place 1 application rectally in the morning and at bedtime.  Marland Kitchen lisinopril (ZESTRIL) 10 MG tablet TAKE 1 TABLET BY MOUTH DAILY.  Marland Kitchen LORazepam (ATIVAN) 0.5 MG tablet Take 1/2 tablet q day prn  . rosuvastatin (CRESTOR) 5 MG tablet TAKE 1 TABLET BY MOUTH DAILY  . [DISCONTINUED] LORazepam (ATIVAN) 0.5 MG tablet Take 1/2 tablet q day prn   No facility-administered encounter medications on file as of 06/10/2020.    Review of Systems  Constitutional: Negative for appetite change and unexpected weight change.  HENT: Negative for congestion, sinus pressure and sore throat.   Eyes: Negative for pain and visual disturbance.  Respiratory: Negative for cough, chest tightness and shortness of breath.   Cardiovascular: Negative for chest pain, palpitations and leg swelling.  Gastrointestinal: Negative for  abdominal pain, diarrhea, nausea and vomiting.  Genitourinary: Negative for difficulty urinating and dysuria.  Musculoskeletal: Negative for joint swelling and myalgias.  Skin: Negative for color change and rash.  Neurological: Negative for dizziness, light-headedness and headaches.  Hematological: Negative for adenopathy. Does not bruise/bleed easily.  Psychiatric/Behavioral: Negative for agitation and dysphoric mood.       Objective:    Physical Exam  BP 110/70   Pulse 66   Temp 98.3 F (36.8 C) (Oral)   Resp 16   Ht 5' 4"  (1.626 m)   Wt 154 lb (69.9 kg)   SpO2 98%   BMI 26.43 kg/m  Wt Readings from Last 3 Encounters:   06/10/20 154 lb (69.9 kg)  12/03/19 151 lb (68.5 kg)  03/05/19 151 lb (68.5 kg)     Lab Results  Component Value Date   WBC 7.4 06/03/2020   HGB 15.2 (H) 06/03/2020   HCT 45.3 06/03/2020   PLT 267.0 06/03/2020   GLUCOSE 96 06/03/2020   CHOL 190 06/03/2020   TRIG 165.0 (H) 06/03/2020   HDL 48.90 06/03/2020   LDLDIRECT 128.0 01/22/2019   LDLCALC 108 (H) 06/03/2020   ALT 19 06/03/2020   AST 18 06/03/2020   NA 141 06/03/2020   K 4.6 06/03/2020   CL 104 06/03/2020   CREATININE 0.71 06/03/2020   BUN 15 06/03/2020   CO2 32 06/03/2020   TSH 3.75 06/05/2019   HGBA1C 6.1 06/03/2020    DG Chest 2 View  Result Date: 03/05/2019 CLINICAL DATA:  Cough for 6 months. EXAM: CHEST - 2 VIEW COMPARISON:  None. FINDINGS: The heart size and mediastinal contours are within normal limits. Both lungs are clear. The visualized skeletal structures are unremarkable. IMPRESSION: No active cardiopulmonary disease. Electronically Signed   By: Marlaine Hind M.D.   On: 03/05/2019 16:42       Assessment & Plan:   Problem List Items Addressed This Visit    Abdominal aortic ectasia ([place])    Found on ultrasound.  Recommended f/u in 5 years.  Due this year. Discussed with her today.  She wants to hold at this time.  Will notify me when agreeable.        Essential hypertension    Continue lisinopril.  Blood pressure as outlined.  Follow pressures.  Follow metabolic panel.       Relevant Orders   TSH   Basic metabolic panel   Health care maintenance    Physical today 06/10/20.  PAP 06/10/20.  Mammogram 01/30/19 - Birads I.  Schedule f/u mammogram.  Colonoscopy 06/2013 - normal.  Wants to hold on hemoccult cards.       Hypercholesterolemia    On crestor.  Low cholesterol diet and exercise.  Follow lipid panel and liver function tests.        Relevant Orders   Lipid panel   Hepatic function panel   Hyperglycemia    Low carb diet and exercise.  Follow met b and a1c.   Lab Results  Component Value  Date   HGBA1C 6.1 06/03/2020        Relevant Orders   Hemoglobin A1c   Stress    Overall appears to be handling things relatively well.  On citalopram.  Doing well on this medication.  Has lorazepam to use prn.  Follow.        Other Visit Diagnoses    Routine general medical examination at a health care facility    -  Primary   Cervical  cancer screening       Relevant Orders   Cytology - PAP( [place]) (Completed)   Visit for screening mammogram       Relevant Orders   MM 3D SCREEN BREAST BILATERAL       Einar Pheasant, MD

## 2020-06-10 NOTE — Assessment & Plan Note (Signed)
Physical today 06/10/20.  PAP 06/10/20.  Mammogram 01/30/19 - Birads I.  Schedule f/u mammogram.  Colonoscopy 06/2013 - normal.  Wants to hold on hemoccult cards.

## 2020-06-12 LAB — CYTOLOGY - PAP
Comment: NEGATIVE
Diagnosis: NEGATIVE
High risk HPV: NEGATIVE

## 2020-06-14 ENCOUNTER — Encounter: Payer: Self-pay | Admitting: Internal Medicine

## 2020-06-14 NOTE — Assessment & Plan Note (Signed)
Low carb diet and exercise.  Follow met b and a1c.   Lab Results  Component Value Date   HGBA1C 6.1 06/03/2020

## 2020-06-14 NOTE — Assessment & Plan Note (Signed)
Found on ultrasound.  Recommended f/u in 5 years.  Due this year. Discussed with her today.  She wants to hold at this time.  Will notify me when agreeable.

## 2020-06-14 NOTE — Assessment & Plan Note (Signed)
Continue lisinopril. Blood pressure as outlined.  Follow pressures.  Follow metabolic panel.  °

## 2020-06-14 NOTE — Assessment & Plan Note (Signed)
On crestor.  Low cholesterol diet and exercise.  Follow lipid panel and liver function tests.   

## 2020-06-14 NOTE — Assessment & Plan Note (Signed)
Overall appears to be handling things relatively well.  On citalopram.  Doing well on this medication.  Has lorazepam to use prn.  Follow.

## 2020-06-18 ENCOUNTER — Telehealth: Payer: Self-pay | Admitting: Internal Medicine

## 2020-06-18 NOTE — Telephone Encounter (Signed)
Patient 's daughter Irving Burton called in stated that Dr.Scott said she would take her as a new patient  I did not see a note stating that she would

## 2020-06-19 NOTE — Telephone Encounter (Signed)
Ok.  She had asked about both of her daughters.  Ok.

## 2020-06-19 NOTE — Telephone Encounter (Signed)
Tammy Daniels 08/07/1994 [phone]

## 2020-06-19 NOTE — Telephone Encounter (Signed)
Please schedule

## 2020-06-23 NOTE — Telephone Encounter (Signed)
LMTCB and ask for Morrie Sheldon to schedule

## 2020-08-18 ENCOUNTER — Other Ambulatory Visit: Payer: Self-pay | Admitting: Internal Medicine

## 2020-08-20 ENCOUNTER — Ambulatory Visit
Admission: RE | Admit: 2020-08-20 | Discharge: 2020-08-20 | Disposition: A | Discharge status: Home / Self Care | Payer: BC Managed Care – PPO | Source: Ambulatory Visit | Attending: Internal Medicine | Admitting: Internal Medicine

## 2020-08-20 ENCOUNTER — Other Ambulatory Visit: Payer: Self-pay

## 2020-08-20 DIAGNOSIS — Z1231 Encounter for screening mammogram for malignant neoplasm of breast: Secondary | ICD-10-CM | POA: Diagnosis not present

## 2020-09-28 ENCOUNTER — Other Ambulatory Visit: Payer: Self-pay | Admitting: Internal Medicine

## 2020-10-14 ENCOUNTER — Other Ambulatory Visit (INDEPENDENT_AMBULATORY_CARE_PROVIDER_SITE_OTHER): Payer: BC Managed Care – PPO

## 2020-10-14 ENCOUNTER — Other Ambulatory Visit: Payer: BC Managed Care – PPO

## 2020-10-14 ENCOUNTER — Other Ambulatory Visit: Payer: Self-pay

## 2020-10-14 DIAGNOSIS — E7800 Pure hypercholesterolemia, unspecified: Secondary | ICD-10-CM

## 2020-10-14 DIAGNOSIS — R739 Hyperglycemia, unspecified: Secondary | ICD-10-CM | POA: Diagnosis not present

## 2020-10-14 DIAGNOSIS — I10 Essential (primary) hypertension: Secondary | ICD-10-CM | POA: Diagnosis not present

## 2020-10-14 LAB — BASIC METABOLIC PANEL
BUN: 17 mg/dL (ref 6–23)
CO2: 28 mEq/L (ref 19–32)
Calcium: 9.5 mg/dL (ref 8.4–10.5)
Chloride: 104 mEq/L (ref 96–112)
Creatinine, Ser: 0.73 mg/dL (ref 0.40–1.20)
GFR: 88.94 mL/min (ref >60.00)
Glucose, Bld: 99 mg/dL (ref 70–99)
Potassium: 4.3 mEq/L (ref 3.5–5.1)
Sodium: 141 mEq/L (ref 135–145)

## 2020-10-14 LAB — HEPATIC FUNCTION PANEL
ALT: 40 U/L — ABNORMAL HIGH (ref 0–35)
AST: 35 U/L (ref 0–37)
Albumin: 4.4 g/dL (ref 3.5–5.2)
Alkaline Phosphatase: 103 U/L (ref 39–117)
Bilirubin, Direct: 0.1 mg/dL (ref 0.0–0.3)
Total Bilirubin: 0.7 mg/dL (ref 0.2–1.2)
Total Protein: 6.9 g/dL (ref 6.0–8.3)

## 2020-10-14 LAB — LIPID PANEL
Cholesterol: 176 mg/dL (ref 0–200)
HDL: 51 mg/dL (ref >39.00)
LDL Cholesterol: 96 mg/dL (ref 0–99)
NonHDL: 125.01
Total CHOL/HDL Ratio: 3
Triglycerides: 143 mg/dL (ref 0.0–149.0)
VLDL: 28.6 mg/dL (ref 0.0–40.0)

## 2020-10-14 LAB — TSH: TSH: 2.86 u[IU]/mL (ref 0.35–4.50)

## 2020-10-14 LAB — HEMOGLOBIN A1C: Hgb A1c MFr Bld: 6.2 % (ref 4.6–6.5)

## 2020-10-19 ENCOUNTER — Ambulatory Visit: Payer: BC Managed Care – PPO | Admitting: Internal Medicine

## 2020-10-19 ENCOUNTER — Other Ambulatory Visit: Payer: Self-pay

## 2020-10-19 VITALS — BP 114/70 | HR 63 | Temp 97.8°F | Resp 16 | Ht 64.0 in | Wt 157.0 lb

## 2020-10-19 DIAGNOSIS — R945 Abnormal results of liver function studies: Secondary | ICD-10-CM

## 2020-10-19 DIAGNOSIS — E7800 Pure hypercholesterolemia, unspecified: Secondary | ICD-10-CM

## 2020-10-19 DIAGNOSIS — I77811 Abdominal aortic ectasia: Secondary | ICD-10-CM

## 2020-10-19 DIAGNOSIS — R7989 Other specified abnormal findings of blood chemistry: Secondary | ICD-10-CM

## 2020-10-19 DIAGNOSIS — F439 Reaction to severe stress, unspecified: Secondary | ICD-10-CM

## 2020-10-19 DIAGNOSIS — Z1159 Encounter for screening for other viral diseases: Secondary | ICD-10-CM

## 2020-10-19 DIAGNOSIS — R739 Hyperglycemia, unspecified: Secondary | ICD-10-CM

## 2020-10-19 DIAGNOSIS — I10 Essential (primary) hypertension: Secondary | ICD-10-CM

## 2020-10-19 NOTE — Progress Notes (Signed)
Patient ID: Tammy Daniels, female   DOB: October 22, 1960, 60 y.o.   MRN: [id number]   Subjective:    Patient ID: Tammy Daniels, female    DOB: 08-03-1960, 60 y.o.   MRN: [id number]  HPI This visit occurred during the SARS-CoV-2 public health emergency.  Safety protocols were in place, including screening questions prior to the visit, additional usage of staff PPE, and extensive cleaning of exam room while observing appropriate contact time as indicated for disinfecting solutions.   Patient here for a scheduled follow up.  Here to follow up regarding her cholesterol and blood pressure.  She retired in 01/2020.  Handling stress.  Tries to stay active.  No chest pain or sob reported.  No abdominal pain.  Bowels moving.  Omeprazole - for reflux.  Handling stress.  Discussed due aortic ultrasound.     Past Medical History:  Diagnosis Date   Allergic rhinitis    Elevated blood pressure    Hypercholesterolemia    Past Surgical History:  Procedure Laterality Date   DILATION AND CURETTAGE, DIAGNOSTIC / THERAPEUTIC  1992   explaratory lap  1992   HYSTEROSCOPY  1997   with cervical polyp removal   Family History  Problem Relation Age of Onset   Hyperlipidemia Mother    Hypertension Mother    Heart disease Father        died 73 MI   Hypertension Brother    Hypothyroidism Sister    Social History   Socioeconomic History   Marital status: Married    Spouse name: Not on file   Number of children: 3   Years of education: Not on file   Highest education level: Not on file  Occupational History    Employer: [employer]  Tobacco Use   Smoking status: Never   Smokeless tobacco: Never  Substance and Sexual Activity   Alcohol use: Yes    Alcohol/week: 0.0 standard drinks   Drug use: No   Sexual activity: Not on file  Other Topics Concern   Not on file  Social History Narrative   Not on file   Social Determinants of Health   Financial Resource Strain: Not on file  Food Insecurity:  Not on file  Transportation Needs: Not on file  Physical Activity: Not on file  Stress: Not on file  Social Connections: Not on file    Review of Systems  Constitutional:  Negative for appetite change and unexpected weight change.  HENT:  Negative for congestion and sinus pressure.   Respiratory:  Negative for cough, chest tightness and shortness of breath.   Cardiovascular:  Negative for chest pain, palpitations and leg swelling.  Gastrointestinal:  Negative for abdominal pain, diarrhea, nausea and vomiting.  Genitourinary:  Negative for difficulty urinating and dysuria.  Musculoskeletal:  Negative for joint swelling and myalgias.  Skin:  Negative for color change and rash.  Neurological:  Negative for dizziness, light-headedness and headaches.  Psychiatric/Behavioral:  Negative for agitation and dysphoric mood.       Objective:    Physical Exam Vitals reviewed.  Constitutional:      General: She is not in acute distress.    Appearance: Normal appearance.  HENT:     Head: Normocephalic and atraumatic.     Right Ear: External ear normal.     Left Ear: External ear normal.  Eyes:     General: No scleral icterus.       Right eye: No discharge.  Left eye: No discharge.     Conjunctiva/sclera: Conjunctivae normal.  Neck:     Thyroid: No thyromegaly.  Cardiovascular:     Rate and Rhythm: Normal rate and regular rhythm.  Pulmonary:     Effort: No respiratory distress.     Breath sounds: Normal breath sounds. No wheezing.  Abdominal:     General: Bowel sounds are normal.     Palpations: Abdomen is soft.     Tenderness: There is no abdominal tenderness.  Musculoskeletal:        General: No swelling or tenderness.     Cervical back: Neck supple. No tenderness.  Lymphadenopathy:     Cervical: No cervical adenopathy.  Skin:    Findings: No erythema or rash.  Neurological:     Mental Status: She is alert.  Psychiatric:        Mood and Affect: Mood normal.         Behavior: Behavior normal.    BP 114/70   Pulse 63   Temp 97.8 F (36.6 C)   Resp 16   Ht _0  (1.626 m)   Wt 157 lb (71.2 kg)   SpO2 98%   BMI 26.95 kg/m  Wt Readings from Last 3 Encounters:  10/19/20 157 lb (71.2 kg)  06/10/20 154 lb (69.9 kg)  12/03/19 151 lb (68.5 kg)    Outpatient Encounter Medications as of 10/19/2020  Medication Sig   CALCIUM PO Take by mouth.   citalopram (CELEXA) 10 MG tablet TAKE ONE TABLET (10 MG) BY MOUTH EVERY DAY   hydrocortisone-pramoxine (ANALPRAM HC) 2.5-1 % rectal cream Place 1 application rectally in the morning and at bedtime.   lisinopril (ZESTRIL) 10 MG tablet TAKE 1 TABLET BY MOUTH DAILY.   LORazepam (ATIVAN) 0.5 MG tablet Take 1/2 tablet q day prn   Omega-3 Fatty Acids (FISH OIL) 1000 MG CAPS Take 1,000 mg by mouth daily.   omeprazole (PRILOSEC) 20 MG capsule Take 20 mg by mouth daily.   rosuvastatin (CRESTOR) 5 MG tablet TAKE 1 TABLET BY MOUTH DAILY   No facility-administered encounter medications on file as of 10/19/2020.     Lab Results  Component Value Date   WBC 7.4 06/03/2020   HGB 15.2 (H) 06/03/2020   HCT 45.3 06/03/2020   PLT 267.0 06/03/2020   GLUCOSE 99 10/14/2020   CHOL 176 10/14/2020   TRIG 143.0 10/14/2020   HDL 51.00 10/14/2020   LDLDIRECT 128.0 01/22/2019   LDLCALC 96 10/14/2020   ALT 40 (H) 10/14/2020   AST 35 10/14/2020   NA 141 10/14/2020   K 4.3 10/14/2020   CL 104 10/14/2020   CREATININE 0.73 10/14/2020   BUN 17 10/14/2020   CO2 28 10/14/2020   TSH 2.86 10/14/2020   HGBA1C 6.2 10/14/2020    MM 3D SCREEN BREAST BILATERAL  Result Date: 08/21/2020 CLINICAL DATA:  Screening. EXAM: DIGITAL SCREENING BILATERAL MAMMOGRAM WITH TOMOSYNTHESIS AND CAD TECHNIQUE: Bilateral screening digital craniocaudal and mediolateral oblique mammograms were obtained. Bilateral screening digital breast tomosynthesis was performed. The images were evaluated with computer-aided detection. COMPARISON:  Previous exam(s). ACR  Breast Density Category c: The breast tissue is heterogeneously dense, which may obscure small masses. FINDINGS: There are no findings suspicious for malignancy. The images were evaluated with computer-aided detection. IMPRESSION: No mammographic evidence of malignancy. A result letter of this screening mammogram will be mailed directly to the patient. RECOMMENDATION: Screening mammogram in one year. (Code:SM-B-01Y) BI-RADS CATEGORY  1: Negative. Electronically Signed   By:  Kristopher Oppenheim M.D.   On: 08/21/2020 09:31       Assessment & Plan:   Problem List Items Addressed This Visit     Abdominal aortic ectasia ([place])    Found on ultrasound.  Recommended f/u in 5 years.  Due this year. Discussed with her today.  She is agreeable.  Given abnormal liver function test, will obtain abdominal ultrasound - to evaluate aorta and liver.         Relevant Orders   US Abdomen Complete   Abnormal liver function test    One liver test slightly elevated.  Check ultrasound as outlined. Discussed diet and exercise.  Follow liver function tests.         Relevant Orders   Hepatic function panel   Hepatic function panel   US Abdomen Complete   Essential hypertension    Continue lisinopril.  Blood pressure as outlined.  Follow pressures.  Follow metabolic panel.        Hypercholesterolemia    On crestor.  Low cholesterol diet and exercise.  Follow lipid panel and liver function tests.         Hyperglycemia    Low carb diet and exercise.  Follow met b and a1c.   Lab Results  Component Value Date   HGBA1C 6.2 10/14/2020        Stress    Increased stress.  Discussed.  Overall appears to be handling things relatively well.  Has retired.  Continue citalopram.  Follow.         Other Visit Diagnoses     Encounter for hepatitis C screening test for low risk patient    -  Primary   Relevant Orders   Hepatitis C antibody        Einar Pheasant, MD

## 2020-10-26 ENCOUNTER — Encounter: Payer: Self-pay | Admitting: Internal Medicine

## 2020-10-26 NOTE — Assessment & Plan Note (Signed)
Increased stress.  Discussed.  Overall appears to be handling things relatively well.  Has retired.  Continue citalopram.  Follow.

## 2020-10-26 NOTE — Assessment & Plan Note (Signed)
Continue lisinopril. Blood pressure as outlined.  Follow pressures.  Follow metabolic panel.  °

## 2020-10-26 NOTE — Assessment & Plan Note (Signed)
Found on ultrasound.  Recommended f/u in 5 years.  Due this year. Discussed with her today.  She is agreeable.  Given abnormal liver function test, will obtain abdominal ultrasound - to evaluate aorta and liver.

## 2020-10-26 NOTE — Assessment & Plan Note (Signed)
On crestor.  Low cholesterol diet and exercise.  Follow lipid panel and liver function tests.   

## 2020-10-26 NOTE — Assessment & Plan Note (Signed)
One liver test slightly elevated.  Check ultrasound as outlined. Discussed diet and exercise.  Follow liver function tests.

## 2020-10-26 NOTE — Assessment & Plan Note (Signed)
Low carb diet and exercise.  Follow met b and a1c.   Lab Results  Component Value Date   HGBA1C 6.2 10/14/2020

## 2020-11-09 ENCOUNTER — Other Ambulatory Visit: Payer: BC Managed Care – PPO

## 2020-11-10 ENCOUNTER — Other Ambulatory Visit: Payer: Self-pay

## 2020-11-10 ENCOUNTER — Ambulatory Visit
Admission: RE | Admit: 2020-11-10 | Discharge: 2020-11-10 | Disposition: A | Discharge status: Home / Self Care | Payer: BC Managed Care – PPO | Source: Ambulatory Visit | Attending: Internal Medicine | Admitting: Internal Medicine

## 2020-11-10 DIAGNOSIS — K760 Fatty (change of) liver, not elsewhere classified: Secondary | ICD-10-CM | POA: Diagnosis not present

## 2020-11-10 DIAGNOSIS — I77811 Abdominal aortic ectasia: Secondary | ICD-10-CM | POA: Diagnosis not present

## 2020-11-10 DIAGNOSIS — R945 Abnormal results of liver function studies: Secondary | ICD-10-CM | POA: Insufficient documentation

## 2020-11-10 DIAGNOSIS — R7989 Other specified abnormal findings of blood chemistry: Secondary | ICD-10-CM

## 2020-11-15 ENCOUNTER — Other Ambulatory Visit: Payer: Self-pay | Admitting: Internal Medicine

## 2020-11-15 DIAGNOSIS — R739 Hyperglycemia, unspecified: Secondary | ICD-10-CM

## 2020-11-15 DIAGNOSIS — R945 Abnormal results of liver function studies: Secondary | ICD-10-CM

## 2020-11-15 DIAGNOSIS — E7800 Pure hypercholesterolemia, unspecified: Secondary | ICD-10-CM

## 2020-11-15 DIAGNOSIS — R7989 Other specified abnormal findings of blood chemistry: Secondary | ICD-10-CM

## 2020-11-15 NOTE — Progress Notes (Signed)
Orders placed for f/u labs.  

## 2020-12-14 ENCOUNTER — Ambulatory Visit: Payer: BC Managed Care – PPO | Admitting: Internal Medicine

## 2021-01-20 ENCOUNTER — Other Ambulatory Visit: Payer: Self-pay

## 2021-01-20 ENCOUNTER — Other Ambulatory Visit (INDEPENDENT_AMBULATORY_CARE_PROVIDER_SITE_OTHER): Payer: BC Managed Care – PPO

## 2021-01-20 DIAGNOSIS — R739 Hyperglycemia, unspecified: Secondary | ICD-10-CM

## 2021-01-20 DIAGNOSIS — E7800 Pure hypercholesterolemia, unspecified: Secondary | ICD-10-CM | POA: Diagnosis not present

## 2021-01-20 LAB — CBC WITH DIFFERENTIAL/PLATELET
Basophils Absolute: 0 10*3/uL (ref 0.0–0.1)
Basophils Relative: 0.6 % (ref 0.0–3.0)
Eosinophils Absolute: 0.4 10*3/uL (ref 0.0–0.7)
Eosinophils Relative: 5.8 % — ABNORMAL HIGH (ref 0.0–5.0)
HCT: 44.7 % (ref 36.0–46.0)
Hemoglobin: 14.6 g/dL (ref 12.0–15.0)
Lymphocytes Relative: 31.1 % (ref 12.0–46.0)
Lymphs Abs: 1.9 10*3/uL (ref 0.7–4.0)
MCHC: 32.7 g/dL (ref 30.0–36.0)
MCV: 90.5 fl (ref 78.0–100.0)
Monocytes Absolute: 0.4 10*3/uL (ref 0.1–1.0)
Monocytes Relative: 7 % (ref 3.0–12.0)
Neutro Abs: 3.5 10*3/uL (ref 1.4–7.7)
Neutrophils Relative %: 55.5 % (ref 43.0–77.0)
Platelets: 227 10*3/uL (ref 150.0–400.0)
RBC: 4.94 Mil/uL (ref 3.87–5.11)
RDW: 13.3 % (ref 11.5–15.5)
WBC: 6.3 10*3/uL (ref 4.0–10.5)

## 2021-01-20 LAB — BASIC METABOLIC PANEL
BUN: 13 mg/dL (ref 6–23)
CO2: 31 mEq/L (ref 19–32)
Calcium: 9.4 mg/dL (ref 8.4–10.5)
Chloride: 106 mEq/L (ref 96–112)
Creatinine, Ser: 0.65 mg/dL (ref 0.40–1.20)
GFR: 95.04 mL/min (ref >60.00)
Glucose, Bld: 95 mg/dL (ref 70–99)
Potassium: 4.1 mEq/L (ref 3.5–5.1)
Sodium: 143 mEq/L (ref 135–145)

## 2021-01-20 LAB — LIPID PANEL
Cholesterol: 159 mg/dL (ref 0–200)
HDL: 51 mg/dL (ref >39.00)
LDL Cholesterol: 87 mg/dL (ref 0–99)
NonHDL: 107.75
Total CHOL/HDL Ratio: 3
Triglycerides: 105 mg/dL (ref 0.0–149.0)
VLDL: 21 mg/dL (ref 0.0–40.0)

## 2021-01-20 LAB — HEMOGLOBIN A1C: Hgb A1c MFr Bld: 6.1 % (ref 4.6–6.5)

## 2021-01-21 ENCOUNTER — Other Ambulatory Visit: Payer: Self-pay | Admitting: Internal Medicine

## 2021-01-21 ENCOUNTER — Other Ambulatory Visit (INDEPENDENT_AMBULATORY_CARE_PROVIDER_SITE_OTHER): Payer: BC Managed Care – PPO

## 2021-01-21 DIAGNOSIS — E7800 Pure hypercholesterolemia, unspecified: Secondary | ICD-10-CM | POA: Diagnosis not present

## 2021-01-21 LAB — HEPATIC FUNCTION PANEL
ALT: 18 U/L (ref 0–35)
AST: 18 U/L (ref 0–37)
Albumin: 4.3 g/dL (ref 3.5–5.2)
Alkaline Phosphatase: 79 U/L (ref 39–117)
Bilirubin, Direct: 0.1 mg/dL (ref 0.0–0.3)
Total Bilirubin: 0.5 mg/dL (ref 0.2–1.2)
Total Protein: 6.7 g/dL (ref 6.0–8.3)

## 2021-01-21 NOTE — Progress Notes (Signed)
Order placed for liver panel check.  

## 2021-02-05 ENCOUNTER — Other Ambulatory Visit: Payer: Self-pay

## 2021-02-05 ENCOUNTER — Ambulatory Visit (INDEPENDENT_AMBULATORY_CARE_PROVIDER_SITE_OTHER): Payer: BC Managed Care – PPO | Admitting: Internal Medicine

## 2021-02-05 ENCOUNTER — Encounter: Payer: Self-pay | Admitting: Internal Medicine

## 2021-02-05 VITALS — BP 120/70 | HR 63 | Temp 97.8°F | Resp 16 | Ht 64.0 in | Wt 152.8 lb

## 2021-02-05 DIAGNOSIS — R739 Hyperglycemia, unspecified: Secondary | ICD-10-CM | POA: Diagnosis not present

## 2021-02-05 DIAGNOSIS — I10 Essential (primary) hypertension: Secondary | ICD-10-CM | POA: Diagnosis not present

## 2021-02-05 DIAGNOSIS — Z23 Encounter for immunization: Secondary | ICD-10-CM

## 2021-02-05 DIAGNOSIS — I77811 Abdominal aortic ectasia: Secondary | ICD-10-CM

## 2021-02-05 DIAGNOSIS — F439 Reaction to severe stress, unspecified: Secondary | ICD-10-CM | POA: Diagnosis not present

## 2021-02-05 DIAGNOSIS — E7800 Pure hypercholesterolemia, unspecified: Secondary | ICD-10-CM

## 2021-02-05 DIAGNOSIS — R7989 Other specified abnormal findings of blood chemistry: Secondary | ICD-10-CM

## 2021-02-05 NOTE — Progress Notes (Signed)
Patient ID: Tammy Daniels, female   DOB: Nov 15, 1960, 60 y.o.   MRN: [id number]   Subjective:    Patient ID: Tammy Daniels, female    DOB: 30-Nov-1960, 60 y.o.   MRN: [id number]  This visit occurred during the SARS-CoV-2 public health emergency.  Safety protocols were in place, including screening questions prior to the visit, additional usage of staff PPE, and extensive cleaning of exam room while observing appropriate contact time as indicated for disinfecting solutions.   Patient here for a scheduled follow up.    Chief Complaint  Patient presents with   Follow-up    Discuss elevated LFTs and abdominal ultrasound- fatty liver.   Marland Kitchen   HPI Recently found to have elevated ALT (slight elevation).  Ultrasound revealed changes c/w fatty liver.  Discussed today.  Discussed diet and exercise.  She has adjusted her diet.  Has lost some weight.  Stays active.   No chest pain or sob reported.  No abdominal pain.  Bowels moving.     Past Medical History:  Diagnosis Date   Allergic rhinitis    Elevated blood pressure    Hypercholesterolemia    Past Surgical History:  Procedure Laterality Date   DILATION AND CURETTAGE, DIAGNOSTIC / THERAPEUTIC  1992   explaratory lap  1992   HYSTEROSCOPY  1997   with cervical polyp removal   Family History  Problem Relation Age of Onset   Hyperlipidemia Mother    Hypertension Mother    Heart disease Father        died 69 MI   Hypertension Brother    Hypothyroidism Sister    Social History   Socioeconomic History   Marital status: Married    Spouse name: Not on file   Number of children: 3   Years of education: Not on file   Highest education level: Not on file  Occupational History    Employer: [employer]  Tobacco Use   Smoking status: Never   Smokeless tobacco: Never  Substance and Sexual Activity   Alcohol use: Yes    Alcohol/week: 0.0 standard drinks   Drug use: No   Sexual activity: Not on file  Other Topics Concern   Not on  file  Social History Narrative   Not on file   Social Determinants of Health   Financial Resource Strain: Not on file  Food Insecurity: Not on file  Transportation Needs: Not on file  Physical Activity: Not on file  Stress: Not on file  Social Connections: Not on file    Review of Systems  Constitutional:  Negative for appetite change and unexpected weight change.  HENT:  Negative for congestion and sinus pressure.   Respiratory:  Negative for cough, chest tightness and shortness of breath.   Cardiovascular:  Negative for chest pain, palpitations and leg swelling.  Gastrointestinal:  Negative for abdominal pain, diarrhea, nausea and vomiting.  Genitourinary:  Negative for difficulty urinating and dysuria.  Musculoskeletal:  Negative for joint swelling and myalgias.  Skin:  Negative for color change and rash.  Neurological:  Negative for dizziness, light-headedness and headaches.  Psychiatric/Behavioral:  Negative for agitation and dysphoric mood.       Objective:     BP 120/70   Pulse 63   Temp 97.8 F (36.6 C)   Resp 16   Ht _0  (1.626 m)   Wt 152 lb 12.8 oz (69.3 kg)   SpO2 98%   BMI 26.23 kg/m  Wt Readings from  Last 3 Encounters:  02/05/21 152 lb 12.8 oz (69.3 kg)  10/19/20 157 lb (71.2 kg)  06/10/20 154 lb (69.9 kg)    Physical Exam Vitals reviewed.  Constitutional:      General: She is not in acute distress.    Appearance: Normal appearance.  HENT:     Head: Normocephalic and atraumatic.     Right Ear: External ear normal.     Left Ear: External ear normal.  Eyes:     General: No scleral icterus.       Right eye: No discharge.        Left eye: No discharge.     Conjunctiva/sclera: Conjunctivae normal.  Neck:     Thyroid: No thyromegaly.  Cardiovascular:     Rate and Rhythm: Normal rate and regular rhythm.  Pulmonary:     Effort: No respiratory distress.     Breath sounds: Normal breath sounds. No wheezing.  Abdominal:     General: Bowel  sounds are normal.     Palpations: Abdomen is soft.     Tenderness: There is no abdominal tenderness.  Musculoskeletal:        General: No swelling or tenderness.     Cervical back: Neck supple. No tenderness.  Lymphadenopathy:     Cervical: No cervical adenopathy.  Skin:    Findings: No erythema or rash.  Neurological:     Mental Status: She is alert.  Psychiatric:        Mood and Affect: Mood normal.        Behavior: Behavior normal.     Outpatient Encounter Medications as of 02/05/2021  Medication Sig   CALCIUM PO Take by mouth.   citalopram (CELEXA) 10 MG tablet TAKE ONE TABLET (10 MG) BY MOUTH EVERY DAY   hydrocortisone-pramoxine (ANALPRAM HC) 2.5-1 % rectal cream Place 1 application rectally in the morning and at bedtime.   lisinopril (ZESTRIL) 10 MG tablet TAKE 1 TABLET BY MOUTH DAILY.   LORazepam (ATIVAN) 0.5 MG tablet Take 1/2 tablet q day prn   Omega-3 Fatty Acids (FISH OIL) 1000 MG CAPS Take 1,000 mg by mouth daily.   omeprazole (PRILOSEC) 20 MG capsule Take 20 mg by mouth daily.   rosuvastatin (CRESTOR) 5 MG tablet TAKE 1 TABLET BY MOUTH DAILY   No facility-administered encounter medications on file as of 02/05/2021.     Lab Results  Component Value Date   WBC 6.3 01/20/2021   HGB 14.6 01/20/2021   HCT 44.7 01/20/2021   PLT 227.0 01/20/2021   GLUCOSE 95 01/20/2021   CHOL 159 01/20/2021   TRIG 105.0 01/20/2021   HDL 51.00 01/20/2021   LDLDIRECT 128.0 01/22/2019   LDLCALC 87 01/20/2021   ALT 18 01/21/2021   AST 18 01/21/2021   NA 143 01/20/2021   K 4.1 01/20/2021   CL 106 01/20/2021   CREATININE 0.65 01/20/2021   BUN 13 01/20/2021   CO2 31 01/20/2021   TSH 2.86 10/14/2020   HGBA1C 6.1 01/20/2021    US Abdomen Complete  Addendum Date: 11/13/2020   ADDENDUM REPORT: 11/13/2020 15:15 ADDENDUM: These findings were discussed with Dr. Einar Pheasant by telephone at the time of this addendum on 11/13/2020 at 3:14 pm. Electronically Signed   By: Anner Crete M.D.   On: 11/13/2020 15:15   Result Date: 11/13/2020 CLINICAL DATA:  60 year old female with abnormal liver function. EXAM: ABDOMEN ULTRASOUND COMPLETE COMPARISON:  Ultrasound dated 12/29/2015. FINDINGS: Gallbladder: No gallstones or wall thickening visualized. No sonographic Percell Miller  sign noted by sonographer. Common bile duct: Diameter: 5 mm Liver: There is diffuse increased liver echogenicity most commonly seen in the setting of fatty infiltration. Superimposed inflammation or fibrosis is not excluded. Clinical correlation is recommended. Portal vein is patent on color Doppler imaging with normal direction of blood flow towards the liver. IVC: No abnormality visualized. Pancreas: Visualized portion unremarkable. Spleen: Size and appearance within normal limits. Right Kidney: Length: 10 cm. Echogenicity within normal limits. No mass or hydronephrosis visualized. Left Kidney: Length: 11 cm. Echogenicity within normal limits. No mass or hydronephrosis visualized. Abdominal aorta: No aneurysm visualized. The aorta is ectatic measuring up to 2.5 cm. Other findings: None. IMPRESSION: Fatty liver, otherwise unremarkable abdominal ultrasound. Electronically Signed: By: Anner Crete M.D. On: 11/10/2020 20:44       Assessment & Plan:   Problem List Items Addressed This Visit     Abdominal aortic ectasia ([place])    Recent ultrasound reviewed.  Will follow.  Continue risk factor modification.       Abnormal liver function test    Recent recheck wnl.  Is watching her diet.  Ultrasound with fatty liver.  Follow.        Essential hypertension    Continue lisinopril.  Blood pressure as outlined.  Follow pressures.  Follow metabolic panel.       Relevant Orders   Basic metabolic panel   Hypercholesterolemia    On crestor.  Low cholesterol diet and exercise.  Follow lipid panel and liver function tests.        Relevant Orders   Hepatic function panel   Lipid panel   Hyperglycemia    Low  carb diet and exercise.  Follow met b and a1c.   Lab Results  Component Value Date   HGBA1C 6.1 01/20/2021       Relevant Orders   Hemoglobin A1c   Stress    Overall appears to be doing well.  Continue citalopram.  Follow.       Other Visit Diagnoses     Need for immunization against influenza    -  Primary   Relevant Orders   Flu Vaccine QUAD 49moIM (Fluarix, Fluzone & Alfiuria Quad PF) (Completed)        CEinar Pheasant MD

## 2021-02-14 ENCOUNTER — Encounter: Payer: Self-pay | Admitting: Internal Medicine

## 2021-02-14 NOTE — Assessment & Plan Note (Signed)
Recent recheck wnl.  Is watching her diet.  Ultrasound with fatty liver.  Follow.

## 2021-02-14 NOTE — Assessment & Plan Note (Signed)
On crestor.  Low cholesterol diet and exercise.  Follow lipid panel and liver function tests.   

## 2021-02-14 NOTE — Assessment & Plan Note (Signed)
Continue lisinopril. Blood pressure as outlined.  Follow pressures.  Follow metabolic panel.  °

## 2021-02-14 NOTE — Assessment & Plan Note (Signed)
Low carb diet and exercise.  Follow met b and a1c.   Lab Results  Component Value Date   HGBA1C 6.1 01/20/2021

## 2021-02-14 NOTE — Assessment & Plan Note (Signed)
Recent ultrasound reviewed.  Will follow.  Continue risk factor modification.  

## 2021-02-14 NOTE — Assessment & Plan Note (Signed)
Overall appears to be doing well.  Continue citalopram.  Follow.  

## 2021-02-20 ENCOUNTER — Other Ambulatory Visit: Payer: Self-pay | Admitting: Internal Medicine

## 2021-05-10 ENCOUNTER — Other Ambulatory Visit: Payer: Self-pay

## 2021-05-10 ENCOUNTER — Other Ambulatory Visit (INDEPENDENT_AMBULATORY_CARE_PROVIDER_SITE_OTHER): Payer: BC Managed Care – PPO

## 2021-05-10 DIAGNOSIS — I10 Essential (primary) hypertension: Secondary | ICD-10-CM | POA: Diagnosis not present

## 2021-05-10 DIAGNOSIS — E7800 Pure hypercholesterolemia, unspecified: Secondary | ICD-10-CM | POA: Diagnosis not present

## 2021-05-10 DIAGNOSIS — Z1159 Encounter for screening for other viral diseases: Secondary | ICD-10-CM

## 2021-05-10 DIAGNOSIS — R739 Hyperglycemia, unspecified: Secondary | ICD-10-CM | POA: Diagnosis not present

## 2021-05-10 LAB — HEPATIC FUNCTION PANEL
ALT: 17 U/L (ref 0–35)
AST: 16 U/L (ref 0–37)
Albumin: 4.5 g/dL (ref 3.5–5.2)
Alkaline Phosphatase: 80 U/L (ref 39–117)
Bilirubin, Direct: 0.1 mg/dL (ref 0.0–0.3)
Total Bilirubin: 0.7 mg/dL (ref 0.2–1.2)
Total Protein: 7 g/dL (ref 6.0–8.3)

## 2021-05-10 LAB — BASIC METABOLIC PANEL
BUN: 19 mg/dL (ref 6–23)
CO2: 29 mEq/L (ref 19–32)
Calcium: 9.5 mg/dL (ref 8.4–10.5)
Chloride: 105 mEq/L (ref 96–112)
Creatinine, Ser: 0.75 mg/dL (ref 0.40–1.20)
GFR: 85.76 mL/min (ref >60.00)
Glucose, Bld: 93 mg/dL (ref 70–99)
Potassium: 4.4 mEq/L (ref 3.5–5.1)
Sodium: 141 mEq/L (ref 135–145)

## 2021-05-10 LAB — LIPID PANEL
Cholesterol: 174 mg/dL (ref 0–200)
HDL: 48.5 mg/dL (ref >39.00)
LDL Cholesterol: 93 mg/dL (ref 0–99)
NonHDL: 125.99
Total CHOL/HDL Ratio: 4
Triglycerides: 165 mg/dL — ABNORMAL HIGH (ref 0.0–149.0)
VLDL: 33 mg/dL (ref 0.0–40.0)

## 2021-05-10 LAB — HEMOGLOBIN A1C: Hgb A1c MFr Bld: 6 % (ref 4.6–6.5)

## 2021-05-11 DIAGNOSIS — Z1159 Encounter for screening for other viral diseases: Secondary | ICD-10-CM | POA: Diagnosis not present

## 2021-05-12 LAB — HEPATITIS C ANTIBODY
Hepatitis C Ab: NONREACTIVE
SIGNAL TO CUT-OFF: 0.07 (ref <1.00)

## 2021-06-11 ENCOUNTER — Ambulatory Visit (INDEPENDENT_AMBULATORY_CARE_PROVIDER_SITE_OTHER): Payer: BC Managed Care – PPO | Admitting: Internal Medicine

## 2021-06-11 ENCOUNTER — Other Ambulatory Visit: Payer: Self-pay

## 2021-06-11 ENCOUNTER — Encounter: Payer: Self-pay | Admitting: Internal Medicine

## 2021-06-11 ENCOUNTER — Telehealth: Payer: Self-pay | Admitting: Internal Medicine

## 2021-06-11 ENCOUNTER — Other Ambulatory Visit (HOSPITAL_COMMUNITY)
Admission: RE | Admit: 2021-06-11 | Discharge: 2021-06-11 | Disposition: A | Discharge status: Home / Self Care | Payer: BC Managed Care – PPO | Source: Ambulatory Visit | Attending: Internal Medicine | Admitting: Internal Medicine

## 2021-06-11 VITALS — BP 120/70 | HR 69 | Temp 97.9°F | Resp 16 | Ht 64.0 in | Wt 148.0 lb

## 2021-06-11 DIAGNOSIS — Z124 Encounter for screening for malignant neoplasm of cervix: Secondary | ICD-10-CM | POA: Insufficient documentation

## 2021-06-11 DIAGNOSIS — Z1231 Encounter for screening mammogram for malignant neoplasm of breast: Secondary | ICD-10-CM | POA: Diagnosis not present

## 2021-06-11 DIAGNOSIS — I10 Essential (primary) hypertension: Secondary | ICD-10-CM

## 2021-06-11 DIAGNOSIS — Z0000 Encounter for general adult medical examination without abnormal findings: Secondary | ICD-10-CM | POA: Diagnosis not present

## 2021-06-11 DIAGNOSIS — R7989 Other specified abnormal findings of blood chemistry: Secondary | ICD-10-CM

## 2021-06-11 DIAGNOSIS — E7800 Pure hypercholesterolemia, unspecified: Secondary | ICD-10-CM

## 2021-06-11 DIAGNOSIS — I77811 Abdominal aortic ectasia: Secondary | ICD-10-CM

## 2021-06-11 DIAGNOSIS — R739 Hyperglycemia, unspecified: Secondary | ICD-10-CM

## 2021-06-11 DIAGNOSIS — F439 Reaction to severe stress, unspecified: Secondary | ICD-10-CM

## 2021-06-11 MED ORDER — ROSUVASTATIN CALCIUM 5 MG PO TABS: ORAL_TABLET | ORAL | 1 refills | Status: DC

## 2021-06-11 NOTE — Assessment & Plan Note (Signed)
Physical today 06/11/21.  PAP 06/11/21.  Mammogram 429/22 - Briads I.  Colonoscopy 06/2013.  Wants to hold on hemoccult cards.

## 2021-06-11 NOTE — Telephone Encounter (Signed)
Patient scheduled for labs 10/07/21 as requested by check out note.   Needing orders placed.

## 2021-06-11 NOTE — Progress Notes (Signed)
Patient ID: Tammy Daniels, female   DOB: [birth year], 61 y.o.   MRN: [id number]   Subjective:    Patient ID: Tammy Daniels, female    DOB: 08-01-1960, 61 y.o.   MRN: [id number]  This visit occurred during the SARS-CoV-2 public health emergency.  Safety protocols were in place, including screening questions prior to the visit, additional usage of staff PPE, and extensive cleaning of exam room while observing appropriate contact time as indicated for disinfecting solutions.   Patient here for her physical exam.   Chief Complaint  Patient presents with   Annual Exam   .   HPI She reports she is doing relatively well.  Has been traveling.  Tries to stay active.  No chest pain or sob reported.  No acid reflux or abdominal pain reported.  [place] moving.  Discussed labs.  Discussed increasing crestor.     Past Medical History:  Diagnosis Date   Allergic rhinitis    Elevated blood pressure    Hypercholesterolemia    Past Surgical History:  Procedure Laterality Date   DILATION AND CURETTAGE, DIAGNOSTIC / THERAPEUTIC  1992   explaratory lap  1992   HYSTEROSCOPY  1997   with cervical polyp removal   Family History  Problem Relation Age of Onset   Hyperlipidemia Mother    Hypertension Mother    Heart disease Father        died 66 MI   Hypertension Brother    Hypothyroidism Sister    Social History   Socioeconomic History   Marital status: Married    Spouse name: Not on file   Number of children: 3   Years of education: Not on file   Highest education level: Not on file  Occupational History    Employer: [employer]  Tobacco Use   Smoking status: Never   Smokeless tobacco: Never  Substance and Sexual Activity   Alcohol use: Yes    Alcohol/week: 0.0 standard drinks   Drug use: No   Sexual activity: Not on file  Other Topics Concern   Not on file  Social History Narrative   Not on file   Social Determinants of Health   Financial Resource Strain: Not on file  Food  Insecurity: Not on file  Transportation Needs: Not on file  Physical Activity: Not on file  Stress: Not on file  Social Connections: Not on file     Review of Systems  Constitutional:  Negative for appetite change and unexpected weight change.  HENT:  Negative for congestion, sinus pressure and sore throat.   Eyes:  Negative for pain and visual disturbance.  Respiratory:  Negative for cough, chest tightness and shortness of breath.   Cardiovascular:  Negative for chest pain, palpitations and leg swelling.  Gastrointestinal:  Negative for abdominal pain, diarrhea, nausea and vomiting.  Genitourinary:  Negative for difficulty urinating and dysuria.  Musculoskeletal:  Negative for joint swelling and myalgias.  Skin:  Negative for color change and rash.  Neurological:  Negative for dizziness, light-headedness and headaches.  Hematological:  Negative for adenopathy. Does not bruise/bleed easily.  Psychiatric/Behavioral:  Negative for agitation and dysphoric mood.       Objective:     BP 120/70    Pulse 69    Temp 97.9 F (36.6 C)    Resp 16    Ht 5' 4"  (1.626 m)    Wt 148 lb (67.1 kg)    SpO2 98%    BMI 25.40 kg/m  Wt Readings from Last 3 Encounters:  06/11/21 148 lb (67.1 kg)  02/05/21 152 lb 12.8 oz (69.3 kg)  10/19/20 157 lb (71.2 kg)    Physical Exam Vitals reviewed.  Constitutional:      General: She is not in acute distress.    Appearance: Normal appearance. She is well-developed.  HENT:     Head: Normocephalic and atraumatic.     Right Ear: External ear normal.     Left Ear: External ear normal.  Eyes:     General: No scleral icterus.       Right eye: No discharge.        Left eye: No discharge.     Conjunctiva/sclera: Conjunctivae normal.  Neck:     Thyroid: No thyromegaly.  Cardiovascular:     Rate and Rhythm: Normal rate and regular rhythm.  Pulmonary:     Effort: No tachypnea, accessory muscle usage or respiratory distress.     Breath sounds: Normal breath  sounds. No decreased breath sounds or wheezing.  Chest:  Breasts:    Right: No inverted nipple, mass, nipple discharge or tenderness (no axillary adenopathy).     Left: No inverted nipple, mass, nipple discharge or tenderness (no axilarry adenopathy).  Abdominal:     General: Bowel sounds are normal.     Palpations: Abdomen is soft.     Tenderness: There is no abdominal tenderness.  Genitourinary:    Comments: Normal external genitalia.  Vaginal vault without lesions.  Cervix identified.  Pap smear performed.  Could not appreciate any adnexal masses or tenderness.   Musculoskeletal:        General: No swelling or tenderness.     Cervical back: Neck supple.  Lymphadenopathy:     Cervical: No cervical adenopathy.  Skin:    Findings: No erythema or rash.  Neurological:     Mental Status: She is alert and oriented to person, place, and time.  Psychiatric:        Mood and Affect: Mood normal.        Behavior: Behavior normal.     Outpatient Encounter Medications as of 06/11/2021  Medication Sig   CALCIUM PO Take by mouth.   citalopram (CELEXA) 10 MG tablet TAKE ONE TABLET (10 MG) BY MOUTH EVERY DAY   hydrocortisone-pramoxine (ANALPRAM HC) 2.5-1 % rectal cream Place 1 application rectally in the morning and at bedtime.   lisinopril (ZESTRIL) 10 MG tablet TAKE 1 TABLET BY MOUTH DAILY.   LORazepam (ATIVAN) 0.5 MG tablet Take 1/2 tablet q day prn   Omega-3 Fatty Acids (FISH OIL) 1000 MG CAPS Take 1,000 mg by mouth daily.   omeprazole (PRILOSEC) 20 MG capsule Take 20 mg by mouth daily.   [DISCONTINUED] rosuvastatin (CRESTOR) 5 MG tablet TAKE 1 TABLET BY MOUTH DAILY   rosuvastatin (CRESTOR) 5 MG tablet Take 2 tablets three days per week (Monday, Wednesday and Friday) and one tablet q day the remainder of the week.   No facility-administered encounter medications on file as of 06/11/2021.     Lab Results  Component Value Date   WBC 6.3 01/20/2021   HGB 14.6 01/20/2021   HCT 44.7  01/20/2021   PLT 227.0 01/20/2021   GLUCOSE 93 05/10/2021   CHOL 174 05/10/2021   TRIG 165.0 (H) 05/10/2021   HDL 48.50 05/10/2021   LDLDIRECT 128.0 01/22/2019   LDLCALC 93 05/10/2021   ALT 17 05/10/2021   AST 16 05/10/2021   NA 141 05/10/2021   K 4.4 05/10/2021  CL 105 05/10/2021   CREATININE 0.75 05/10/2021   BUN 19 05/10/2021   CO2 29 05/10/2021   TSH 2.86 10/14/2020   HGBA1C 6.0 05/10/2021    US Abdomen Complete  Addendum Date: 11/13/2020   ADDENDUM REPORT: 11/13/2020 15:15 ADDENDUM: These findings were discussed with Dr. Einar Pheasant by telephone at the time of this addendum on 11/13/2020 at 3:14 pm. Electronically Signed   By: Anner Crete M.D.   On: 11/13/2020 15:15   Result Date: 11/13/2020 CLINICAL DATA:  61 year old female with abnormal liver function. EXAM: ABDOMEN ULTRASOUND COMPLETE COMPARISON:  Ultrasound dated 12/29/2015. FINDINGS: Gallbladder: No gallstones or wall thickening visualized. No sonographic Murphy sign noted by sonographer. Common bile duct: Diameter: 5 mm Liver: There is diffuse increased liver echogenicity most commonly seen in the setting of fatty infiltration. Superimposed inflammation or fibrosis is not excluded. Clinical correlation is recommended. Portal vein is patent on color Doppler imaging with normal direction of blood flow towards the liver. IVC: No abnormality visualized. Pancreas: Visualized portion unremarkable. Spleen: Size and appearance within normal limits. Right Kidney: Length: 10 cm. Echogenicity within normal limits. No mass or hydronephrosis visualized. Left Kidney: Length: 11 cm. Echogenicity within normal limits. No mass or hydronephrosis visualized. Abdominal aorta: No aneurysm visualized. The aorta is ectatic measuring up to 2.5 cm. Other findings: None. IMPRESSION: Fatty liver, otherwise unremarkable abdominal ultrasound. Electronically Signed: By: Anner Crete M.D. On: 11/10/2020 20:44       Assessment & Plan:   Problem  List Items Addressed This Visit     Abdominal aortic ectasia ([place])    Recent ultrasound reviewed.  Will follow.  Continue risk factor modification.       Relevant Medications   rosuvastatin (CRESTOR) 5 MG tablet   Abnormal liver function test    Previous ultrasound with fatty liver.  Recent liver panel wnl.       Essential hypertension    Continue lisinopril.  Blood pressure as outlined.  Follow pressures.  Follow metabolic panel.       Relevant Medications   rosuvastatin (CRESTOR) 5 MG tablet   Other Relevant Orders   Basic metabolic panel   Health care maintenance    Physical today 06/11/21.  PAP 06/11/21.  Mammogram 429/22 - [facility] I.  Colonoscopy 06/2013.  Wants to hold on hemoccult cards.       Hypercholesterolemia    On crestor.  Low cholesterol diet and exercise.  Discussed recent labs.  Increase crestor - 72m Monday, Wednesday and Friday and 513mall other days.  Follow lipid panel and liver function tests.        Relevant Medications   rosuvastatin (CRESTOR) 5 MG tablet   Other Relevant Orders   TSH   Lipid panel   Hepatic function panel   Hyperglycemia    Low carb diet and exercise.  Follow met b and a1c.   Lab Results  Component Value Date   HGBA1C 6.0 05/10/2021       Relevant Orders   Hemoglobin A1c   Stress    Overall appears to be doing well.  Continue citalopram.  Follow.       Other Visit Diagnoses     Routine general medical examination at a health care facility    -  Primary   Visit for screening mammogram       Relevant Orders   MM 3D SCREEN BREAST BILATERAL   Cervical cancer screening       Relevant Orders   Cytology -  PAP( [place])        Einar Pheasant, MD

## 2021-06-12 ENCOUNTER — Encounter: Payer: Self-pay | Admitting: Internal Medicine

## 2021-06-12 NOTE — Assessment & Plan Note (Signed)
Previous ultrasound with fatty liver.  Recent liver panel wnl.

## 2021-06-12 NOTE — Assessment & Plan Note (Addendum)
On crestor.  Low cholesterol diet and exercise.  Discussed recent labs.  Increase crestor - 10mg  Monday, Wednesday and Friday and 5mg  all other days.  Follow lipid panel and liver function tests.

## 2021-06-12 NOTE — Assessment & Plan Note (Signed)
Overall appears to be doing well.  Continue citalopram.  Follow.  

## 2021-06-12 NOTE — Assessment & Plan Note (Signed)
Continue lisinopril. Blood pressure as outlined.  Follow pressures.  Follow metabolic panel.  °

## 2021-06-12 NOTE — Assessment & Plan Note (Signed)
Low carb diet and exercise.  Follow met b and a1c.   Lab Results  Component Value Date   HGBA1C 6.0 05/10/2021

## 2021-06-12 NOTE — Assessment & Plan Note (Signed)
Recent ultrasound reviewed.  Will follow.  Continue risk factor modification.  

## 2021-06-14 NOTE — Telephone Encounter (Signed)
Labs ordered.

## 2021-06-16 LAB — CYTOLOGY - PAP
Comment: NEGATIVE
Diagnosis: NEGATIVE
High risk HPV: NEGATIVE

## 2021-07-06 ENCOUNTER — Other Ambulatory Visit: Payer: Self-pay | Admitting: Internal Medicine

## 2021-09-01 ENCOUNTER — Other Ambulatory Visit: Payer: Self-pay | Admitting: Internal Medicine

## 2021-10-07 ENCOUNTER — Other Ambulatory Visit: Payer: BC Managed Care – PPO

## 2021-10-11 ENCOUNTER — Ambulatory Visit: Payer: BC Managed Care – PPO | Admitting: Internal Medicine

## 2021-11-01 ENCOUNTER — Ambulatory Visit: Payer: BC Managed Care – PPO | Admitting: Internal Medicine

## 2021-11-03 ENCOUNTER — Other Ambulatory Visit: Payer: BC Managed Care – PPO

## 2021-11-08 ENCOUNTER — Ambulatory Visit: Payer: BC Managed Care – PPO | Admitting: Internal Medicine

## 2021-11-10 ENCOUNTER — Ambulatory Visit
Admission: RE | Admit: 2021-11-10 | Discharge: 2021-11-10 | Disposition: A | Discharge status: Home / Self Care | Payer: BC Managed Care – PPO | Source: Ambulatory Visit | Attending: Internal Medicine | Admitting: Internal Medicine

## 2021-11-10 ENCOUNTER — Other Ambulatory Visit (INDEPENDENT_AMBULATORY_CARE_PROVIDER_SITE_OTHER): Payer: BC Managed Care – PPO

## 2021-11-10 DIAGNOSIS — Z1231 Encounter for screening mammogram for malignant neoplasm of breast: Secondary | ICD-10-CM | POA: Diagnosis not present

## 2021-11-10 DIAGNOSIS — R739 Hyperglycemia, unspecified: Secondary | ICD-10-CM | POA: Diagnosis not present

## 2021-11-10 DIAGNOSIS — I10 Essential (primary) hypertension: Secondary | ICD-10-CM | POA: Diagnosis not present

## 2021-11-10 DIAGNOSIS — E7800 Pure hypercholesterolemia, unspecified: Secondary | ICD-10-CM

## 2021-11-10 LAB — LIPID PANEL
Cholesterol: 157 mg/dL (ref 0–200)
HDL: 48.4 mg/dL (ref >39.00)
LDL Cholesterol: 83 mg/dL (ref 0–99)
NonHDL: 108.83
Total CHOL/HDL Ratio: 3
Triglycerides: 129 mg/dL (ref 0.0–149.0)
VLDL: 25.8 mg/dL (ref 0.0–40.0)

## 2021-11-10 LAB — HEPATIC FUNCTION PANEL
ALT: 16 U/L (ref 0–35)
AST: 17 U/L (ref 0–37)
Albumin: 4.4 g/dL (ref 3.5–5.2)
Alkaline Phosphatase: 79 U/L (ref 39–117)
Bilirubin, Direct: 0.1 mg/dL (ref 0.0–0.3)
Total Bilirubin: 0.5 mg/dL (ref 0.2–1.2)
Total Protein: 6.5 g/dL (ref 6.0–8.3)

## 2021-11-10 LAB — BASIC METABOLIC PANEL
BUN: 21 mg/dL (ref 6–23)
CO2: 26 mEq/L (ref 19–32)
Calcium: 9.1 mg/dL (ref 8.4–10.5)
Chloride: 108 mEq/L (ref 96–112)
Creatinine, Ser: 0.66 mg/dL (ref 0.40–1.20)
GFR: 94.16 mL/min (ref >60.00)
Glucose, Bld: 97 mg/dL (ref 70–99)
Potassium: 4.2 mEq/L (ref 3.5–5.1)
Sodium: 142 mEq/L (ref 135–145)

## 2021-11-10 LAB — TSH: TSH: 3.36 u[IU]/mL (ref 0.35–5.50)

## 2021-11-10 LAB — HEMOGLOBIN A1C: Hgb A1c MFr Bld: 6.2 % (ref 4.6–6.5)

## 2021-11-17 ENCOUNTER — Encounter: Payer: Self-pay | Admitting: Internal Medicine

## 2021-11-17 ENCOUNTER — Ambulatory Visit: Payer: BC Managed Care – PPO | Admitting: Internal Medicine

## 2021-11-17 DIAGNOSIS — I77811 Abdominal aortic ectasia: Secondary | ICD-10-CM

## 2021-11-17 DIAGNOSIS — R7989 Other specified abnormal findings of blood chemistry: Secondary | ICD-10-CM

## 2021-11-17 DIAGNOSIS — F439 Reaction to severe stress, unspecified: Secondary | ICD-10-CM

## 2021-11-17 DIAGNOSIS — R739 Hyperglycemia, unspecified: Secondary | ICD-10-CM

## 2021-11-17 DIAGNOSIS — I10 Essential (primary) hypertension: Secondary | ICD-10-CM | POA: Diagnosis not present

## 2021-11-17 DIAGNOSIS — E7800 Pure hypercholesterolemia, unspecified: Secondary | ICD-10-CM | POA: Diagnosis not present

## 2021-11-17 MED ORDER — ROSUVASTATIN CALCIUM 10 MG PO TABS: 10.0000 mg | ORAL_TABLET | Freq: Every day | ORAL | 3 refills | Status: DC

## 2021-11-17 NOTE — Progress Notes (Signed)
Patient ID: Tammy Daniels, female   DOB: 03/28/1961, 61 y.o.   MRN: [id number]   Subjective:    Patient ID: Tammy Daniels, female    DOB: 04-24-1961, 61 y.o.   MRN: [id number]   Patient here for a scheduled follow up .   HPI Here to follow up regarding her blood pressure and cholesterol.  States she is doing relatively well.  Handling stress. Tries to stay active.  Doing some traveling.  No chest pain or sob reported.  No abdominal pain or bowel issues reported.  Taking crestor 24m q day.  Tolerating.  Discussed labs.  Triglycerides improved.    Past Medical History:  Diagnosis Date   Allergic rhinitis    Elevated blood pressure    Hypercholesterolemia    Past Surgical History:  Procedure Laterality Date   DILATION AND CURETTAGE, DIAGNOSTIC / THERAPEUTIC  1992   explaratory lap  1992   HYSTEROSCOPY  1997   with cervical polyp removal   Family History  Problem Relation Age of Onset   Hyperlipidemia Mother    Hypertension Mother    Heart disease Father        died 529MI   Hypothyroidism Sister    Hypertension Brother    Breast cancer Neg Hx    Social History   Socioeconomic History   Marital status: Married    Spouse name: Not on file   Number of children: 3   Years of education: Not on file   Highest education level: Not on file  Occupational History    Employer: [employer] Tobacco Use   Smoking status: Never   Smokeless tobacco: Never  Substance and Sexual Activity   Alcohol use: Yes    Alcohol/week: 0.0 standard drinks of alcohol   Drug use: No   Sexual activity: Not on file  Other Topics Concern   Not on file  Social History Narrative   Not on file   Social Determinants of Health   Financial Resource Strain: Not on file  Food Insecurity: Not on file  Transportation Needs: Not on file  Physical Activity: Not on file  Stress: Not on file  Social Connections: Not on file     Review of Systems  Constitutional:  Negative for appetite change and  unexpected weight change.  HENT:  Negative for congestion and sinus pressure.   Respiratory:  Negative for cough, chest tightness and shortness of breath.   Cardiovascular:  Negative for chest pain, palpitations and leg swelling.  Gastrointestinal:  Negative for abdominal pain, diarrhea, nausea and vomiting.  Genitourinary:  Negative for difficulty urinating and dysuria.  Musculoskeletal:  Negative for joint swelling and myalgias.  Skin:  Negative for color change and rash.  Neurological:  Negative for dizziness, light-headedness and headaches.  Psychiatric/Behavioral:  Negative for agitation and dysphoric mood.        Objective:     BP 128/70 (BP Location: Left Arm, Patient Position: Sitting, Cuff Size: Small)   Pulse 70   Temp 98.5 F (36.9 C) (Temporal)   Resp 17   Ht _0  (1.626 m)   Wt 150 lb 12.8 oz (68.4 kg)   SpO2 97%   BMI 25.88 kg/m  Wt Readings from Last 3 Encounters:  11/17/21 150 lb 12.8 oz (68.4 kg)  06/11/21 148 lb (67.1 kg)  02/05/21 152 lb 12.8 oz (69.3 kg)    Physical Exam Vitals reviewed.  Constitutional:      General: She is not  in acute distress.    Appearance: Normal appearance.  HENT:     Head: Normocephalic and atraumatic.     Right Ear: External ear normal.     Left Ear: External ear normal.  Eyes:     General: No scleral icterus.       Right eye: No discharge.        Left eye: No discharge.     Conjunctiva/sclera: Conjunctivae normal.  Neck:     Thyroid: No thyromegaly.  Cardiovascular:     Rate and Rhythm: Normal rate and regular rhythm.  Pulmonary:     Effort: No respiratory distress.     Breath sounds: Normal breath sounds. No wheezing.  Abdominal:     General: Bowel sounds are normal.     Palpations: Abdomen is soft.     Tenderness: There is no abdominal tenderness.  Musculoskeletal:        General: No swelling or tenderness.     Cervical back: Neck supple. No tenderness.  Lymphadenopathy:     Cervical: No cervical  adenopathy.  Skin:    Findings: No erythema or rash.  Neurological:     Mental Status: She is alert.  Psychiatric:        Mood and Affect: Mood normal.        Behavior: Behavior normal.      Outpatient Encounter Medications as of 11/17/2021  Medication Sig   CALCIUM PO Take by mouth.   citalopram (CELEXA) 10 MG tablet TAKE 1 TABLET BY MOUTH DAILY   hydrocortisone-pramoxine (ANALPRAM HC) 2.5-1 % rectal cream Place 1 application rectally in the morning and at bedtime.   lisinopril (ZESTRIL) 10 MG tablet TAKE 1 TABLET BY MOUTH DAILY.   LORazepam (ATIVAN) 0.5 MG tablet Take 1/2 tablet q day prn   Omega-3 Fatty Acids (FISH OIL) 1000 MG CAPS Take 1,000 mg by mouth daily.   omeprazole (PRILOSEC) 20 MG capsule Take 20 mg by mouth daily.   rosuvastatin (CRESTOR) 10 MG tablet Take 1 tablet (10 mg total) by mouth daily.   [DISCONTINUED] rosuvastatin (CRESTOR) 5 MG tablet Take 2 tablets three days per week (Monday, Wednesday and Friday) and one tablet q day the remainder of the week.   No facility-administered encounter medications on file as of 11/17/2021.     Lab Results  Component Value Date   WBC 6.3 01/20/2021   HGB 14.6 01/20/2021   HCT 44.7 01/20/2021   PLT 227.0 01/20/2021   GLUCOSE 97 11/10/2021   CHOL 157 11/10/2021   TRIG 129.0 11/10/2021   HDL 48.40 11/10/2021   LDLDIRECT 128.0 01/22/2019   LDLCALC 83 11/10/2021   ALT 16 11/10/2021   AST 17 11/10/2021   NA 142 11/10/2021   K 4.2 11/10/2021   CL 108 11/10/2021   CREATININE 0.66 11/10/2021   BUN 21 11/10/2021   CO2 26 11/10/2021   TSH 3.36 11/10/2021   HGBA1C 6.2 11/10/2021    MM 3D SCREEN BREAST BILATERAL  Result Date: 11/11/2021 CLINICAL DATA:  Screening. EXAM: DIGITAL SCREENING BILATERAL MAMMOGRAM WITH TOMOSYNTHESIS AND CAD TECHNIQUE: Bilateral screening digital craniocaudal and mediolateral oblique mammograms were obtained. Bilateral screening digital breast tomosynthesis was performed. The images were evaluated  with computer-aided detection. COMPARISON:  Previous exam(s). ACR Breast Density Category c: The breast tissue is heterogeneously dense, which may obscure small masses. FINDINGS: There are no findings suspicious for malignancy. IMPRESSION: No mammographic evidence of malignancy. A result letter of this screening mammogram will be mailed directly to the  patient. RECOMMENDATION: Screening mammogram in one year. (Code:SM-B-01Y) BI-RADS CATEGORY  1: Negative. Electronically Signed   By: Valentino Saxon M.D.   On: 11/11/2021 16:38       Assessment & Plan:   Problem List Items Addressed This Visit     Abdominal aortic ectasia ([place])    Recent ultrasound reviewed.  Will follow.  Continue risk factor modification.       Relevant Medications   rosuvastatin (CRESTOR) 10 MG tablet   Abnormal liver function test    Previous ultrasound with fatty liver.  Recent liver panel wnl. Recent liver panel wnl.       Essential hypertension    Continue lisinopril.  Blood pressure as outlined.  Follow pressures.  Follow metabolic panel.       Relevant Medications   rosuvastatin (CRESTOR) 10 MG tablet   Hypercholesterolemia    Taking crestor 49m q day.  Low cholesterol diet and exercise.  Follow lipid panel and liver function tests.   Lab Results  Component Value Date   CHOL 157 11/10/2021   HDL 48.40 11/10/2021   LDLCALC 83 11/10/2021   LDLDIRECT 128.0 01/22/2019   TRIG 129.0 11/10/2021   CHOLHDL 3 11/10/2021       Relevant Medications   rosuvastatin (CRESTOR) 10 MG tablet   Hyperglycemia    Low carb diet and exercise.  Follow met b and a1c.   Lab Results  Component Value Date   HGBA1C 6.2 11/10/2021       Stress    Overall appears to be doing well.  Continue citalopram.  Follow.         CEinar Pheasant MD

## 2021-11-28 ENCOUNTER — Encounter: Payer: Self-pay | Admitting: Internal Medicine

## 2021-11-28 NOTE — Assessment & Plan Note (Signed)
Low carb diet and exercise.  Follow met b and a1c.  Lab Results  Component Value Date   HGBA1C 6.2 11/10/2021   

## 2021-11-28 NOTE — Assessment & Plan Note (Signed)
Continue lisinopril. Blood pressure as outlined.  Follow pressures.  Follow metabolic panel.  °

## 2021-11-28 NOTE — Assessment & Plan Note (Signed)
Overall appears to be doing well.  Continue citalopram.  Follow.  

## 2021-11-28 NOTE — Assessment & Plan Note (Signed)
Previous ultrasound with fatty liver.  Recent liver panel wnl. Recent liver panel wnl.  

## 2021-11-28 NOTE — Assessment & Plan Note (Signed)
Taking crestor 5mg  q day.  Low cholesterol diet and exercise.  Follow lipid panel and liver function tests.   Lab Results  Component Value Date   CHOL 157 11/10/2021   HDL 48.40 11/10/2021   LDLCALC 83 11/10/2021   LDLDIRECT 128.0 01/22/2019   TRIG 129.0 11/10/2021   CHOLHDL 3 11/10/2021

## 2021-11-28 NOTE — Assessment & Plan Note (Signed)
Recent ultrasound reviewed.  Will follow.  Continue risk factor modification.  

## 2021-12-16 ENCOUNTER — Other Ambulatory Visit: Payer: Self-pay | Admitting: Internal Medicine

## 2022-02-04 ENCOUNTER — Telehealth: Payer: Self-pay | Admitting: Family

## 2022-02-04 ENCOUNTER — Other Ambulatory Visit: Payer: Self-pay

## 2022-02-04 MED ORDER — LISINOPRIL 10 MG PO TABS: 10.0000 mg | ORAL_TABLET | Freq: Every day | ORAL | 1 refills | Status: DC

## 2022-02-04 NOTE — Telephone Encounter (Signed)
Patient notified that prescription was sent. 

## 2022-02-04 NOTE — Telephone Encounter (Signed)
Pt need a refill on lisinopril 90 day sent to [facility]

## 2022-03-15 ENCOUNTER — Other Ambulatory Visit (INDEPENDENT_AMBULATORY_CARE_PROVIDER_SITE_OTHER): Payer: BC Managed Care – PPO

## 2022-03-15 DIAGNOSIS — E7800 Pure hypercholesterolemia, unspecified: Secondary | ICD-10-CM | POA: Diagnosis not present

## 2022-03-15 DIAGNOSIS — R739 Hyperglycemia, unspecified: Secondary | ICD-10-CM | POA: Diagnosis not present

## 2022-03-15 LAB — CBC WITH DIFFERENTIAL/PLATELET
Basophils Absolute: 0 10*3/uL (ref 0.0–0.1)
Basophils Relative: 0.4 % (ref 0.0–3.0)
Eosinophils Absolute: 0.4 10*3/uL (ref 0.0–0.7)
Eosinophils Relative: 5.4 % — ABNORMAL HIGH (ref 0.0–5.0)
HCT: 45.4 % (ref 36.0–46.0)
Hemoglobin: 14.9 g/dL (ref 12.0–15.0)
Lymphocytes Relative: 33.8 % (ref 12.0–46.0)
Lymphs Abs: 2.3 10*3/uL (ref 0.7–4.0)
MCHC: 32.8 g/dL (ref 30.0–36.0)
MCV: 90 fl (ref 78.0–100.0)
Monocytes Absolute: 0.5 10*3/uL (ref 0.1–1.0)
Monocytes Relative: 7.1 % (ref 3.0–12.0)
Neutro Abs: 3.7 10*3/uL (ref 1.4–7.7)
Neutrophils Relative %: 53.3 % (ref 43.0–77.0)
Platelets: 267 10*3/uL (ref 150.0–400.0)
RBC: 5.04 Mil/uL (ref 3.87–5.11)
RDW: 12.9 % (ref 11.5–15.5)
WBC: 6.9 10*3/uL (ref 4.0–10.5)

## 2022-03-15 LAB — LIPID PANEL
Cholesterol: 162 mg/dL (ref 0–200)
HDL: 55.4 mg/dL (ref >39.00)
LDL Cholesterol: 82 mg/dL (ref 0–99)
NonHDL: 106.12
Total CHOL/HDL Ratio: 3
Triglycerides: 122 mg/dL (ref 0.0–149.0)
VLDL: 24.4 mg/dL (ref 0.0–40.0)

## 2022-03-15 LAB — BASIC METABOLIC PANEL
BUN: 15 mg/dL (ref 6–23)
CO2: 32 mEq/L (ref 19–32)
Calcium: 9.1 mg/dL (ref 8.4–10.5)
Chloride: 101 mEq/L (ref 96–112)
Creatinine, Ser: 0.68 mg/dL (ref 0.40–1.20)
GFR: 93.26 mL/min (ref >60.00)
Glucose, Bld: 98 mg/dL (ref 70–99)
Potassium: 3.8 mEq/L (ref 3.5–5.1)
Sodium: 136 mEq/L (ref 135–145)

## 2022-03-15 LAB — HEPATIC FUNCTION PANEL
ALT: 21 U/L (ref 0–35)
AST: 20 U/L (ref 0–37)
Albumin: 4.4 g/dL (ref 3.5–5.2)
Alkaline Phosphatase: 87 U/L (ref 39–117)
Bilirubin, Direct: 0.1 mg/dL (ref 0.0–0.3)
Total Bilirubin: 0.6 mg/dL (ref 0.2–1.2)
Total Protein: 6.7 g/dL (ref 6.0–8.3)

## 2022-03-15 LAB — HEMOGLOBIN A1C: Hgb A1c MFr Bld: 6.2 % (ref 4.6–6.5)

## 2022-03-22 ENCOUNTER — Ambulatory Visit: Payer: BC Managed Care – PPO | Admitting: Internal Medicine

## 2022-03-22 ENCOUNTER — Encounter: Payer: Self-pay | Admitting: Internal Medicine

## 2022-03-22 VITALS — BP 122/82 | HR 64 | Temp 98.0°F | Resp 15 | Ht 64.0 in | Wt 150.0 lb

## 2022-03-22 DIAGNOSIS — I77811 Abdominal aortic ectasia: Secondary | ICD-10-CM | POA: Diagnosis not present

## 2022-03-22 DIAGNOSIS — R7989 Other specified abnormal findings of blood chemistry: Secondary | ICD-10-CM

## 2022-03-22 DIAGNOSIS — R739 Hyperglycemia, unspecified: Secondary | ICD-10-CM

## 2022-03-22 DIAGNOSIS — E7800 Pure hypercholesterolemia, unspecified: Secondary | ICD-10-CM

## 2022-03-22 DIAGNOSIS — F439 Reaction to severe stress, unspecified: Secondary | ICD-10-CM

## 2022-03-22 DIAGNOSIS — Z23 Encounter for immunization: Secondary | ICD-10-CM

## 2022-03-22 DIAGNOSIS — I10 Essential (primary) hypertension: Secondary | ICD-10-CM

## 2022-03-22 MED ORDER — CITALOPRAM HYDROBROMIDE 10 MG PO TABS: 10.0000 mg | ORAL_TABLET | Freq: Every day | ORAL | 1 refills | Status: DC

## 2022-03-22 NOTE — Progress Notes (Signed)
Patient ID: Tammy Daniels, female   DOB: Oct 23, 1960, 61 y.o.   MRN: [id number]   Subjective:    Patient ID: Tammy Daniels, female    DOB: Feb 20, 1961, 61 y.o.   MRN: [id number]   Patient here for  Chief Complaint  Patient presents with   Follow-up   .   HPI Here to follow up regarding hypercholesterolemia and hypertension. Reports she is doing well.  Has been traveling.  Staying active. No chest pain or sob reported.  No abdominal pain or bowel change reported.  Handling stress.    Past Medical History:  Diagnosis Date   Allergic rhinitis    Elevated blood pressure    Hypercholesterolemia    Past Surgical History:  Procedure Laterality Date   DILATION AND CURETTAGE, DIAGNOSTIC / THERAPEUTIC  1992   explaratory lap  1992   HYSTEROSCOPY  1997   with cervical polyp removal   Family History  Problem Relation Age of Onset   Hyperlipidemia Mother    Hypertension Mother    Heart disease Father        died 37 MI   Hypothyroidism Sister    Hypertension Brother    Breast cancer Neg Hx    Social History   Socioeconomic History   Marital status: Married    Spouse name: Not on file   Number of children: 3   Years of education: Not on file   Highest education level: Not on file  Occupational History    Employer: [employer]  Tobacco Use   Smoking status: Never   Smokeless tobacco: Never  Substance and Sexual Activity   Alcohol use: Yes    Alcohol/week: 0.0 standard drinks of alcohol   Drug use: No   Sexual activity: Not on file  Other Topics Concern   Not on file  Social History Narrative   Not on file   Social Determinants of Health   Financial Resource Strain: Not on file  Food Insecurity: Not on file  Transportation Needs: Not on file  Physical Activity: Not on file  Stress: Not on file  Social Connections: Not on file     Review of Systems  Constitutional:  Negative for appetite change and unexpected weight change.  HENT:  Negative for congestion  and sinus pressure.   Respiratory:  Negative for cough, chest tightness and shortness of breath.   Cardiovascular:  Negative for chest pain, palpitations and leg swelling.  Gastrointestinal:  Negative for abdominal pain, diarrhea, nausea and vomiting.  Genitourinary:  Negative for difficulty urinating and dysuria.  Musculoskeletal:  Negative for joint swelling and myalgias.  Skin:  Negative for color change and rash.  Neurological:  Negative for dizziness and headaches.  Psychiatric/Behavioral:  Negative for agitation and dysphoric mood.        Objective:     BP 122/82   Pulse 64   Temp 98 F (36.7 C) (Temporal)   Resp 15   Ht _0  (1.626 m)   Wt 150 lb (68 kg)   SpO2 97%   BMI 25.75 kg/m  Wt Readings from Last 3 Encounters:  03/22/22 150 lb (68 kg)  11/17/21 150 lb 12.8 oz (68.4 kg)  06/11/21 148 lb (67.1 kg)    Physical Exam Vitals reviewed.  Constitutional:      General: She is not in acute distress.    Appearance: Normal appearance.  HENT:     Head: Normocephalic and atraumatic.     Right Ear: External ear  normal.     Left Ear: External ear normal.  Eyes:     General: No scleral icterus.       Right eye: No discharge.        Left eye: No discharge.     Conjunctiva/sclera: Conjunctivae normal.  Neck:     Thyroid: No thyromegaly.  Cardiovascular:     Rate and Rhythm: Normal rate and regular rhythm.  Pulmonary:     Effort: No respiratory distress.     Breath sounds: Normal breath sounds. No wheezing.  Abdominal:     General: Bowel sounds are normal.     Palpations: Abdomen is soft.     Tenderness: There is no abdominal tenderness.  Musculoskeletal:        General: No swelling or tenderness.     Cervical back: Neck supple. No tenderness.  Lymphadenopathy:     Cervical: No cervical adenopathy.  Skin:    Findings: No erythema or rash.  Neurological:     Mental Status: She is alert.  Psychiatric:        Mood and Affect: Mood normal.        Behavior:  Behavior normal.      Outpatient Encounter Medications as of 03/22/2022  Medication Sig   CALCIUM PO Take by mouth.   hydrocortisone-pramoxine (ANALPRAM HC) 2.5-1 % rectal cream Place 1 application rectally in the morning and at bedtime.   lisinopril (ZESTRIL) 10 MG tablet Take 1 tablet (10 mg total) by mouth daily.   LORazepam (ATIVAN) 0.5 MG tablet Take 1/2 tablet q day prn   Omega-3 Fatty Acids (FISH OIL) 1000 MG CAPS Take 1,000 mg by mouth daily.   omeprazole (PRILOSEC) 20 MG capsule Take 20 mg by mouth daily.   rosuvastatin (CRESTOR) 10 MG tablet Take 1 tablet (10 mg total) by mouth daily.   [DISCONTINUED] citalopram (CELEXA) 10 MG tablet TAKE 1 TABLET BY MOUTH DAILY   citalopram (CELEXA) 10 MG tablet Take 1 tablet (10 mg total) by mouth daily.   No facility-administered encounter medications on file as of 03/22/2022.     Lab Results  Component Value Date   WBC 6.9 03/15/2022   HGB 14.9 03/15/2022   HCT 45.4 03/15/2022   PLT 267.0 03/15/2022   GLUCOSE 98 03/15/2022   CHOL 162 03/15/2022   TRIG 122.0 03/15/2022   HDL 55.40 03/15/2022   LDLDIRECT 128.0 01/22/2019   LDLCALC 82 03/15/2022   ALT 21 03/15/2022   AST 20 03/15/2022   NA 136 03/15/2022   K 3.8 03/15/2022   CL 101 03/15/2022   CREATININE 0.68 03/15/2022   BUN 15 03/15/2022   CO2 32 03/15/2022   TSH 3.36 11/10/2021   HGBA1C 6.2 03/15/2022    MM 3D SCREEN BREAST BILATERAL  Result Date: 11/11/2021 CLINICAL DATA:  Screening. EXAM: DIGITAL SCREENING BILATERAL MAMMOGRAM WITH TOMOSYNTHESIS AND CAD TECHNIQUE: Bilateral screening digital craniocaudal and mediolateral oblique mammograms were obtained. Bilateral screening digital breast tomosynthesis was performed. The images were evaluated with computer-aided detection. COMPARISON:  Previous exam(s). ACR Breast Density Category c: The breast tissue is heterogeneously dense, which may obscure small masses. FINDINGS: There are no findings suspicious for malignancy.  IMPRESSION: No mammographic evidence of malignancy. A result letter of this screening mammogram will be mailed directly to the patient. RECOMMENDATION: Screening mammogram in one year. (Code:SM-B-01Y) BI-RADS CATEGORY  1: Negative. Electronically Signed   By: Valentino Saxon M.D.   On: 11/11/2021 16:38       Assessment & Plan:  Problem List Items Addressed This Visit     Abdominal aortic ectasia ([place])    Recent ultrasound reviewed.  Will follow.  Continue risk factor modification.       Abnormal liver function test    Previous ultrasound with fatty liver.  Recent liver panel wnl. Recent liver panel wnl.       Essential hypertension - Primary    Continue lisinopril.  Blood pressure as outlined.  Follow pressures.  Follow metabolic panel.       Relevant Orders   Basic metabolic panel   Hypercholesterolemia    Taking crestor 49m q day.  Low cholesterol diet and exercise.  Follow lipid panel and liver function tests.   Lab Results  Component Value Date   CHOL 162 03/15/2022   HDL 55.40 03/15/2022   LDLCALC 82 03/15/2022   LDLDIRECT 128.0 01/22/2019   TRIG 122.0 03/15/2022   CHOLHDL 3 03/15/2022       Relevant Orders   Hepatic function panel   Lipid panel   Hyperglycemia    Low carb diet and exercise.  Follow met b and a1c.   Lab Results  Component Value Date   HGBA1C 6.2 03/15/2022       Relevant Orders   Hemoglobin A1c   Stress    Overall appears to be doing well.  Continue citalopram.  Follow.       Other Visit Diagnoses     Need for influenza vaccination       Relevant Orders   Flu Vaccine QUAD 6+ mos PF IM (Fluarix Quad PF) (Completed)        CEinar Pheasant MD

## 2022-03-27 ENCOUNTER — Encounter: Payer: Self-pay | Admitting: Internal Medicine

## 2022-03-27 NOTE — Assessment & Plan Note (Signed)
Overall appears to be doing well.  Continue citalopram.  Follow.

## 2022-03-27 NOTE — Addendum Note (Signed)
Addended by: Charm Barges on: 03/27/2022 07:21 PM   Modules accepted: Level of Service

## 2022-03-27 NOTE — Assessment & Plan Note (Signed)
Previous ultrasound with fatty liver.  Recent liver panel wnl. Recent liver panel wnl.

## 2022-03-27 NOTE — Assessment & Plan Note (Signed)
Recent ultrasound reviewed.  Will follow.  Continue risk factor modification.

## 2022-03-27 NOTE — Assessment & Plan Note (Signed)
Continue lisinopril. Blood pressure as outlined.  Follow pressures.  Follow metabolic panel.  °

## 2022-03-27 NOTE — Assessment & Plan Note (Signed)
Low carb diet and exercise.  Follow met b and a1c.   Lab Results  Component Value Date   HGBA1C 6.2 03/15/2022

## 2022-03-27 NOTE — Assessment & Plan Note (Addendum)
Taking crestor 10mg  q day.  Low cholesterol diet and exercise.  Follow lipid panel and liver function tests.   Lab Results  Component Value Date   CHOL 162 03/15/2022   HDL 55.40 03/15/2022   LDLCALC 82 03/15/2022   LDLDIRECT 128.0 01/22/2019   TRIG 122.0 03/15/2022   CHOLHDL 3 03/15/2022

## 2022-07-18 ENCOUNTER — Other Ambulatory Visit (INDEPENDENT_AMBULATORY_CARE_PROVIDER_SITE_OTHER): Payer: BC Managed Care – PPO

## 2022-07-18 DIAGNOSIS — R739 Hyperglycemia, unspecified: Secondary | ICD-10-CM

## 2022-07-18 DIAGNOSIS — I10 Essential (primary) hypertension: Secondary | ICD-10-CM | POA: Diagnosis not present

## 2022-07-18 DIAGNOSIS — E7800 Pure hypercholesterolemia, unspecified: Secondary | ICD-10-CM | POA: Diagnosis not present

## 2022-07-18 LAB — BASIC METABOLIC PANEL
BUN: 16 mg/dL (ref 6–23)
CO2: 27 mEq/L (ref 19–32)
Calcium: 9.6 mg/dL (ref 8.4–10.5)
Chloride: 105 mEq/L (ref 96–112)
Creatinine, Ser: 0.72 mg/dL (ref 0.40–1.20)
GFR: 89.32 mL/min (ref >60.00)
Glucose, Bld: 94 mg/dL (ref 70–99)
Potassium: 4.2 mEq/L (ref 3.5–5.1)
Sodium: 141 mEq/L (ref 135–145)

## 2022-07-18 LAB — LIPID PANEL
Cholesterol: 163 mg/dL (ref 0–200)
HDL: 51.7 mg/dL (ref >39.00)
LDL Cholesterol: 81 mg/dL (ref 0–99)
NonHDL: 111.52
Total CHOL/HDL Ratio: 3
Triglycerides: 153 mg/dL — ABNORMAL HIGH (ref 0.0–149.0)
VLDL: 30.6 mg/dL (ref 0.0–40.0)

## 2022-07-18 LAB — HEPATIC FUNCTION PANEL
ALT: 23 U/L (ref 0–35)
AST: 19 U/L (ref 0–37)
Albumin: 4.6 g/dL (ref 3.5–5.2)
Alkaline Phosphatase: 88 U/L (ref 39–117)
Bilirubin, Direct: 0.1 mg/dL (ref 0.0–0.3)
Total Bilirubin: 0.6 mg/dL (ref 0.2–1.2)
Total Protein: 7.1 g/dL (ref 6.0–8.3)

## 2022-07-18 LAB — HEMOGLOBIN A1C: Hgb A1c MFr Bld: 6.2 % (ref 4.6–6.5)

## 2022-07-25 ENCOUNTER — Other Ambulatory Visit (HOSPITAL_COMMUNITY)
Admission: RE | Admit: 2022-07-25 | Discharge: 2022-07-25 | Disposition: A | Discharge status: Home / Self Care | Payer: BC Managed Care – PPO | Source: Ambulatory Visit | Attending: Internal Medicine | Admitting: Internal Medicine

## 2022-07-25 ENCOUNTER — Encounter: Payer: Self-pay | Admitting: Internal Medicine

## 2022-07-25 ENCOUNTER — Ambulatory Visit (INDEPENDENT_AMBULATORY_CARE_PROVIDER_SITE_OTHER): Payer: BC Managed Care – PPO | Admitting: Internal Medicine

## 2022-07-25 VITALS — BP 116/70 | HR 70 | Temp 98.2°F | Resp 16 | Ht 65.0 in | Wt 151.0 lb

## 2022-07-25 DIAGNOSIS — L989 Disorder of the skin and subcutaneous tissue, unspecified: Secondary | ICD-10-CM

## 2022-07-25 DIAGNOSIS — Z0000 Encounter for general adult medical examination without abnormal findings: Secondary | ICD-10-CM

## 2022-07-25 DIAGNOSIS — Z124 Encounter for screening for malignant neoplasm of cervix: Secondary | ICD-10-CM | POA: Insufficient documentation

## 2022-07-25 DIAGNOSIS — Z1231 Encounter for screening mammogram for malignant neoplasm of breast: Secondary | ICD-10-CM

## 2022-07-25 DIAGNOSIS — R7989 Other specified abnormal findings of blood chemistry: Secondary | ICD-10-CM

## 2022-07-25 DIAGNOSIS — R739 Hyperglycemia, unspecified: Secondary | ICD-10-CM

## 2022-07-25 DIAGNOSIS — I10 Essential (primary) hypertension: Secondary | ICD-10-CM

## 2022-07-25 DIAGNOSIS — F439 Reaction to severe stress, unspecified: Secondary | ICD-10-CM

## 2022-07-25 DIAGNOSIS — E7800 Pure hypercholesterolemia, unspecified: Secondary | ICD-10-CM

## 2022-07-25 DIAGNOSIS — I77811 Abdominal aortic ectasia: Secondary | ICD-10-CM

## 2022-07-25 MED ORDER — LORAZEPAM 0.5 MG PO TABS: ORAL_TABLET | ORAL | 0 refills | Status: AC

## 2022-07-25 NOTE — Progress Notes (Addendum)
Subjective:    Patient ID: Tammy Daniels, female    DOB: [birth year], 62 y.o.   MRN: [id number]  Patient here for  Chief Complaint  Patient presents with   Annual Exam    HPI Reports she is doing relatively well.  Continues on citalopram. Handling stress relatively well.  Does request to have refill lorazepam - to have if needed.  Rarely uses. Tries to stay active.  No chest pain or sob reported.  No abdominal pain reported.  Skin lesion - breast - discussed dermatology referral.    Past Medical History:  Diagnosis Date   Allergic rhinitis    Elevated blood pressure    Hypercholesterolemia    Past Surgical History:  Procedure Laterality Date   DILATION AND CURETTAGE, DIAGNOSTIC / THERAPEUTIC  1992   explaratory lap  1992   HYSTEROSCOPY  1997   with cervical polyp removal   Family History  Problem Relation Age of Onset   Hyperlipidemia Mother    Hypertension Mother    Heart disease Father        died 58 MI   Hypothyroidism Sister    Hypertension Brother    Breast cancer Neg Hx    Social History   Socioeconomic History   Marital status: Married    Spouse name: Not on file   Number of children: 3   Years of education: Not on file   Highest education level: Not on file  Occupational History    Employer: [employer]  Tobacco Use   Smoking status: Never   Smokeless tobacco: Never  Substance and Sexual Activity   Alcohol use: Yes    Alcohol/week: 0.0 standard drinks of alcohol   Drug use: No   Sexual activity: Not on file  Other Topics Concern   Not on file  Social History Narrative   Not on file   Social Determinants of Health   Financial Resource Strain: Not on file  Food Insecurity: Not on file  Transportation Needs: Not on file  Physical Activity: Not on file  Stress: Not on file  Social Connections: Not on file     Review of Systems  Constitutional:  Negative for appetite change and unexpected weight change.  HENT:  Negative for  congestion, sinus pressure and sore throat.   Eyes:  Negative for pain and visual disturbance.  Respiratory:  Negative for cough, chest tightness and shortness of breath.   Cardiovascular:  Negative for chest pain and palpitations.  Gastrointestinal:  Negative for abdominal pain, diarrhea, nausea and vomiting.  Genitourinary:  Negative for difficulty urinating and dysuria.  Musculoskeletal:  Negative for joint swelling and myalgias.  Skin:  Negative for color change and rash.  Neurological:  Negative for dizziness and headaches.  Hematological:  Negative for adenopathy. Does not bruise/bleed easily.  Psychiatric/Behavioral:  Negative for agitation and dysphoric mood.        Objective:     BP 116/70   Pulse 70   Temp 98.2 F (36.8 C)   Resp 16   Ht 5\' 5"  (1.651 m)   Wt 151 lb (68.5 kg)   SpO2 98%   BMI 25.13 kg/m  Wt Readings from Last 3 Encounters:  07/25/22 151 lb (68.5 kg)  03/22/22 150 lb (68 kg)  11/17/21 150 lb 12.8 oz (68.4 kg)    Physical Exam Vitals reviewed.  Constitutional:      General: She is not in acute distress.    Appearance: Normal appearance. She is  well-developed.  HENT:     Head: Normocephalic and atraumatic.     Right Ear: External ear normal.     Left Ear: External ear normal.  Eyes:     General: No scleral icterus.       Right eye: No discharge.        Left eye: No discharge.     Conjunctiva/sclera: Conjunctivae normal.  Neck:     Thyroid: No thyromegaly.  Cardiovascular:     Rate and Rhythm: Normal rate and regular rhythm.  Pulmonary:     Effort: No tachypnea, accessory muscle usage or respiratory distress.     Breath sounds: Normal breath sounds. No decreased breath sounds or wheezing.  Chest:  Breasts:    Right: No inverted nipple, mass, nipple discharge or tenderness (no axillary adenopathy).     Left: No inverted nipple, mass, nipple discharge or tenderness (no axilarry adenopathy).  Abdominal:     General: Bowel sounds are  normal.     Palpations: Abdomen is soft.     Tenderness: There is no abdominal tenderness.  Genitourinary:    Comments: Normal external genitalia.  Vaginal vault without lesions.  Cervix identified.  Pap smear performed.  Could not appreciate any adnexal masses or tenderness.   Musculoskeletal:        General: No swelling or tenderness.     Cervical back: Neck supple.  Lymphadenopathy:     Cervical: No cervical adenopathy.  Skin:    Findings: No erythema or rash.  Neurological:     Mental Status: She is alert and oriented to person, place, and time.  Psychiatric:        Mood and Affect: Mood normal.        Behavior: Behavior normal.      Outpatient Encounter Medications as of 07/25/2022  Medication Sig   CALCIUM PO Take by mouth.   citalopram (CELEXA) 10 MG tablet Take 1 tablet (10 mg total) by mouth daily.   hydrocortisone-pramoxine (ANALPRAM HC) 2.5-1 % rectal cream Place 1 application rectally in the morning and at bedtime.   lisinopril (ZESTRIL) 10 MG tablet Take 1 tablet (10 mg total) by mouth daily.   LORazepam (ATIVAN) 0.5 MG tablet Take 1/2 tablet q day prn   Omega-3 Fatty Acids (FISH OIL) 1000 MG CAPS Take 1,000 mg by mouth daily.   omeprazole (PRILOSEC) 20 MG capsule Take 20 mg by mouth daily.   rosuvastatin (CRESTOR) 10 MG tablet Take 1 tablet (10 mg total) by mouth daily.   [DISCONTINUED] LORazepam (ATIVAN) 0.5 MG tablet Take 1/2 tablet q day prn   No facility-administered encounter medications on file as of 07/25/2022.     Lab Results  Component Value Date   WBC 6.9 03/15/2022   HGB 14.9 03/15/2022   HCT 45.4 03/15/2022   PLT 267.0 03/15/2022   GLUCOSE 94 07/18/2022   CHOL 163 07/18/2022   TRIG 153.0 (H) 07/18/2022   HDL 51.70 07/18/2022   LDLDIRECT 128.0 01/22/2019   LDLCALC 81 07/18/2022   ALT 23 07/18/2022   AST 19 07/18/2022   NA 141 07/18/2022   K 4.2 07/18/2022   CL 105 07/18/2022   CREATININE 0.72 07/18/2022   BUN 16 07/18/2022   CO2 27  07/18/2022   TSH 3.36 11/10/2021   HGBA1C 6.2 07/18/2022    MM 3D SCREEN BREAST BILATERAL  Result Date: 11/11/2021 CLINICAL DATA:  Screening. EXAM: DIGITAL SCREENING BILATERAL MAMMOGRAM WITH TOMOSYNTHESIS AND CAD TECHNIQUE: Bilateral screening digital craniocaudal and mediolateral oblique  mammograms were obtained. Bilateral screening digital breast tomosynthesis was performed. The images were evaluated with computer-aided detection. COMPARISON:  Previous exam(s). ACR Breast Density Category c: The breast tissue is heterogeneously dense, which may obscure small masses. FINDINGS: There are no findings suspicious for malignancy. IMPRESSION: No mammographic evidence of malignancy. A result letter of this screening mammogram will be mailed directly to the patient. RECOMMENDATION: Screening mammogram in one year. (Code:SM-B-01Y) BI-RADS CATEGORY  1: Negative. Electronically Signed   By: Meda Klinefelter M.D.   On: 11/11/2021 16:38       Assessment & Plan:  Routine general medical examination at a health care facility  Screening for cervical cancer -     Cytology - PAP  Visit for screening mammogram -     3D Screening Mammogram, Left and Right; Future  Hyperglycemia Assessment & Plan: Low carb diet and exercise.  Follow met b and a1c.   Lab Results  Component Value Date   HGBA1C 6.2 07/18/2022    Orders: -     Hemoglobin A1c; Future  Hypercholesterolemia Assessment & Plan: Taking crestor 10mg  q day.  Low cholesterol diet and exercise.  Follow lipid panel and liver function tests.   Lab Results  Component Value Date   CHOL 163 07/18/2022   HDL 51.70 07/18/2022   LDLCALC 81 07/18/2022   LDLDIRECT 128.0 01/22/2019   TRIG 153.0 (H) 07/18/2022   CHOLHDL 3 07/18/2022    Orders: -     Lipid panel; Future -     Hepatic function panel; Future  Essential hypertension Assessment & Plan: Continue lisinopril.  Blood pressure as outlined.  Follow pressures.  Follow metabolic panel.    Orders: -     Basic metabolic panel; Future -     TSH; Future  Health care maintenance Assessment & Plan: Physical today 07/25/22.  PAP 06/11/21 - changes c/w atrophy.  HPV negative.  PAP today. Mammogram 11/10/21 - [facility] I.  Colonoscopy 06/2013.  Wants to hold on hemoccult cards. Follow up colonoscopy in 10 years.    Abdominal aortic ectasia Assessment & Plan: Recent ultrasound reviewed.  Will follow.  Continue risk factor modification.    Abnormal liver function test Assessment & Plan: Previous ultrasound with fatty liver.  Recent liver panel wnl.    Stress Assessment & Plan: Overall appears to be doing well.  Continue citalopram.  Follow.    Skin lesion Assessment & Plan: Left breast.  Request referral to dermatology.    Orders: -     Ambulatory referral to Dermatology  Other orders -     LORazepam; Take 1/2 tablet q day prn  Dispense: 20 tablet; Refill: 0     Dale [place], MD

## 2022-07-25 NOTE — Assessment & Plan Note (Addendum)
Physical today 07/25/22.  PAP 06/11/21 - changes c/w atrophy.  HPV negative.  PAP today. Mammogram 11/10/21 - Briads I.  Colonoscopy 06/2013.  Wants to hold on hemoccult cards. Follow up colonoscopy in 10 years.

## 2022-07-27 LAB — CYTOLOGY - PAP
Comment: NEGATIVE
Diagnosis: NEGATIVE
High risk HPV: NEGATIVE

## 2022-07-31 ENCOUNTER — Encounter: Payer: Self-pay | Admitting: Internal Medicine

## 2022-07-31 DIAGNOSIS — L989 Disorder of the skin and subcutaneous tissue, unspecified: Secondary | ICD-10-CM | POA: Insufficient documentation

## 2022-07-31 NOTE — Assessment & Plan Note (Signed)
Overall appears to be doing well.  Continue citalopram.  Follow.  °

## 2022-07-31 NOTE — Assessment & Plan Note (Signed)
Left breast.  Request referral to dermatology.

## 2022-07-31 NOTE — Assessment & Plan Note (Signed)
Recent ultrasound reviewed.  Will follow.  Continue risk factor modification.  

## 2022-07-31 NOTE — Assessment & Plan Note (Signed)
Previous ultrasound with fatty liver.  Recent liver panel wnl.  °

## 2022-07-31 NOTE — Assessment & Plan Note (Signed)
Taking crestor 10mg  q day.  Low cholesterol diet and exercise.  Follow lipid panel and liver function tests.   Lab Results  Component Value Date   CHOL 163 07/18/2022   HDL 51.70 07/18/2022   LDLCALC 81 07/18/2022   LDLDIRECT 128.0 01/22/2019   TRIG 153.0 (H) 07/18/2022   CHOLHDL 3 07/18/2022

## 2022-07-31 NOTE — Assessment & Plan Note (Signed)
Continue lisinopril. Blood pressure as outlined.  Follow pressures.  Follow metabolic panel.  °

## 2022-07-31 NOTE — Assessment & Plan Note (Signed)
Low carb diet and exercise.  Follow met b and a1c.   Lab Results  Component Value Date   HGBA1C 6.2 07/18/2022

## 2022-08-17 ENCOUNTER — Other Ambulatory Visit: Payer: Self-pay | Admitting: Internal Medicine

## 2022-10-25 DIAGNOSIS — D2261 Melanocytic nevi of right upper limb, including shoulder: Secondary | ICD-10-CM | POA: Diagnosis not present

## 2022-10-25 DIAGNOSIS — D225 Melanocytic nevi of trunk: Secondary | ICD-10-CM | POA: Diagnosis not present

## 2022-10-25 DIAGNOSIS — L538 Other specified erythematous conditions: Secondary | ICD-10-CM | POA: Diagnosis not present

## 2022-10-25 DIAGNOSIS — L820 Inflamed seborrheic keratosis: Secondary | ICD-10-CM | POA: Diagnosis not present

## 2022-10-25 DIAGNOSIS — D2262 Melanocytic nevi of left upper limb, including shoulder: Secondary | ICD-10-CM | POA: Diagnosis not present

## 2022-10-25 DIAGNOSIS — L578 Other skin changes due to chronic exposure to nonionizing radiation: Secondary | ICD-10-CM | POA: Diagnosis not present

## 2022-11-03 DIAGNOSIS — H524 Presbyopia: Secondary | ICD-10-CM | POA: Diagnosis not present

## 2022-11-03 DIAGNOSIS — H52223 Regular astigmatism, bilateral: Secondary | ICD-10-CM | POA: Diagnosis not present

## 2022-11-03 DIAGNOSIS — H5213 Myopia, bilateral: Secondary | ICD-10-CM | POA: Diagnosis not present

## 2022-11-19 ENCOUNTER — Other Ambulatory Visit: Payer: Self-pay | Admitting: Internal Medicine

## 2022-11-22 ENCOUNTER — Ambulatory Visit
Admission: RE | Admit: 2022-11-22 | Discharge: 2022-11-22 | Disposition: A | Discharge status: Home / Self Care | Payer: BC Managed Care – PPO | Source: Ambulatory Visit | Attending: Internal Medicine | Admitting: Internal Medicine

## 2022-11-22 DIAGNOSIS — Z1231 Encounter for screening mammogram for malignant neoplasm of breast: Secondary | ICD-10-CM | POA: Insufficient documentation

## 2022-12-12 ENCOUNTER — Other Ambulatory Visit: Payer: Self-pay | Admitting: Internal Medicine

## 2023-01-17 ENCOUNTER — Other Ambulatory Visit: Payer: BC Managed Care – PPO

## 2023-01-24 ENCOUNTER — Ambulatory Visit: Payer: BC Managed Care – PPO | Admitting: Internal Medicine

## 2023-02-14 ENCOUNTER — Other Ambulatory Visit: Payer: Self-pay | Admitting: Internal Medicine

## 2023-02-27 ENCOUNTER — Other Ambulatory Visit (INDEPENDENT_AMBULATORY_CARE_PROVIDER_SITE_OTHER): Payer: BC Managed Care – PPO

## 2023-02-27 DIAGNOSIS — R739 Hyperglycemia, unspecified: Secondary | ICD-10-CM | POA: Diagnosis not present

## 2023-02-27 DIAGNOSIS — I10 Essential (primary) hypertension: Secondary | ICD-10-CM | POA: Diagnosis not present

## 2023-02-27 DIAGNOSIS — E7800 Pure hypercholesterolemia, unspecified: Secondary | ICD-10-CM

## 2023-02-27 LAB — TSH: TSH: 3.43 u[IU]/mL (ref 0.35–5.50)

## 2023-02-27 LAB — LIPID PANEL
Cholesterol: 158 mg/dL (ref 0–200)
HDL: 50.3 mg/dL (ref >39.00)
LDL Cholesterol: 83 mg/dL (ref 0–99)
NonHDL: 107.73
Total CHOL/HDL Ratio: 3
Triglycerides: 124 mg/dL (ref 0.0–149.0)
VLDL: 24.8 mg/dL (ref 0.0–40.0)

## 2023-02-27 LAB — HEPATIC FUNCTION PANEL
ALT: 22 U/L (ref 0–35)
AST: 21 U/L (ref 0–37)
Albumin: 4.2 g/dL (ref 3.5–5.2)
Alkaline Phosphatase: 81 U/L (ref 39–117)
Bilirubin, Direct: 0.1 mg/dL (ref 0.0–0.3)
Total Bilirubin: 0.5 mg/dL (ref 0.2–1.2)
Total Protein: 7 g/dL (ref 6.0–8.3)

## 2023-02-27 LAB — BASIC METABOLIC PANEL
BUN: 17 mg/dL (ref 6–23)
CO2: 30 meq/L (ref 19–32)
Calcium: 9.4 mg/dL (ref 8.4–10.5)
Chloride: 105 meq/L (ref 96–112)
Creatinine, Ser: 0.68 mg/dL (ref 0.40–1.20)
GFR: 92.64 mL/min (ref >60.00)
Glucose, Bld: 104 mg/dL — ABNORMAL HIGH (ref 70–99)
Potassium: 4.3 meq/L (ref 3.5–5.1)
Sodium: 141 meq/L (ref 135–145)

## 2023-02-27 LAB — HEMOGLOBIN A1C: Hgb A1c MFr Bld: 6.1 % (ref 4.6–6.5)

## 2023-03-02 ENCOUNTER — Ambulatory Visit: Payer: BC Managed Care – PPO | Admitting: Internal Medicine

## 2023-03-03 ENCOUNTER — Encounter: Payer: Self-pay | Admitting: Internal Medicine

## 2023-03-03 ENCOUNTER — Ambulatory Visit (INDEPENDENT_AMBULATORY_CARE_PROVIDER_SITE_OTHER): Payer: BC Managed Care – PPO | Admitting: Internal Medicine

## 2023-03-03 VITALS — BP 120/78 | HR 72 | Temp 97.7°F | Ht 65.0 in | Wt 151.4 lb

## 2023-03-03 DIAGNOSIS — I10 Essential (primary) hypertension: Secondary | ICD-10-CM | POA: Diagnosis not present

## 2023-03-03 DIAGNOSIS — Z23 Encounter for immunization: Secondary | ICD-10-CM

## 2023-03-03 DIAGNOSIS — E7800 Pure hypercholesterolemia, unspecified: Secondary | ICD-10-CM

## 2023-03-03 DIAGNOSIS — R739 Hyperglycemia, unspecified: Secondary | ICD-10-CM

## 2023-03-03 DIAGNOSIS — I77811 Abdominal aortic ectasia: Secondary | ICD-10-CM

## 2023-03-03 DIAGNOSIS — F439 Reaction to severe stress, unspecified: Secondary | ICD-10-CM | POA: Diagnosis not present

## 2023-03-03 DIAGNOSIS — R7989 Other specified abnormal findings of blood chemistry: Secondary | ICD-10-CM

## 2023-03-03 NOTE — Progress Notes (Unsigned)
Subjective:    Patient ID: Tammy Daniels, female    DOB: 1960/11/16, 62 y.o.   MRN: [id number]  Patient here for  Chief Complaint  Patient presents with   Medical Management of Chronic Issues    HPI Here for f/u regarding her blood pressure, cholesterol and increased stress. She reports she is doing well.  Feels good.  Staying active.  No chest pain or sob reported.  No cough or congestion.  Recently traveled to [place].  When returned - had issues with her stomach.  Some diarrhea and abdominal discomfort. This lasted for approximately one week.  Resolved.  No problems now.     Past Medical History:  Diagnosis Date   Allergic rhinitis    Elevated blood pressure    Hypercholesterolemia    Past Surgical History:  Procedure Laterality Date   DILATION AND CURETTAGE, DIAGNOSTIC / THERAPEUTIC  1992   explaratory lap  1992   HYSTEROSCOPY  1997   with cervical polyp removal   Family History  Problem Relation Age of Onset   Hyperlipidemia Mother    Hypertension Mother    Heart disease Father        died 35 MI   Hypothyroidism Sister    Hypertension Brother    Breast cancer Neg Hx    Social History   Socioeconomic History   Marital status: Married    Spouse name: Not on file   Number of children: 3   Years of education: Not on file   Highest education level: Not on file  Occupational History    Employer: [employer]  Tobacco Use   Smoking status: Never   Smokeless tobacco: Never  Substance and Sexual Activity   Alcohol use: Yes    Alcohol/week: 0.0 standard drinks of alcohol   Drug use: No   Sexual activity: Not on file  Other Topics Concern   Not on file  Social History Narrative   Not on file   Social Determinants of Health   Financial Resource Strain: Not on file  Food Insecurity: Not on file  Transportation Needs: Not on file  Physical Activity: Not on file  Stress: Not on file  Social Connections: Not on file     Review of Systems   Constitutional:  Negative for appetite change and unexpected weight change.  HENT:  Negative for congestion and sinus pressure.   Respiratory:  Negative for cough, chest tightness and shortness of breath.   Cardiovascular:  Negative for chest pain and palpitations.  Gastrointestinal:  Negative for abdominal pain, diarrhea, nausea and vomiting.  Genitourinary:  Negative for difficulty urinating and dysuria.  Musculoskeletal:  Negative for joint swelling and myalgias.  Skin:  Negative for color change and rash.  Neurological:  Negative for dizziness and headaches.  Psychiatric/Behavioral:  Negative for agitation and dysphoric mood.        Objective:     BP 120/78   Pulse 72   Temp 97.7 F (36.5 C)   Ht 5\' 5"  (1.651 m)   Wt 151 lb 6.4 oz (68.7 kg)   SpO2 98%   BMI 25.19 kg/m  Wt Readings from Last 3 Encounters:  03/03/23 151 lb 6.4 oz (68.7 kg)  07/25/22 151 lb (68.5 kg)  03/22/22 150 lb (68 kg)    Physical Exam Vitals reviewed.  Constitutional:      General: She is not in acute distress.    Appearance: Normal appearance.  HENT:     Head: Normocephalic  and atraumatic.     Right Ear: External ear normal.     Left Ear: External ear normal.  Eyes:     General: No scleral icterus.       Right eye: No discharge.        Left eye: No discharge.     Conjunctiva/sclera: Conjunctivae normal.  Neck:     Thyroid: No thyromegaly.  Cardiovascular:     Rate and Rhythm: Normal rate and regular rhythm.  Pulmonary:     Effort: No respiratory distress.     Breath sounds: Normal breath sounds. No wheezing.  Abdominal:     General: Bowel sounds are normal.     Palpations: Abdomen is soft.     Tenderness: There is no abdominal tenderness.  Musculoskeletal:        General: No swelling or tenderness.     Cervical back: Neck supple. No tenderness.  Lymphadenopathy:     Cervical: No cervical adenopathy.  Skin:    Findings: No erythema or rash.  Neurological:     Mental Status:  She is alert.  Psychiatric:        Mood and Affect: Mood normal.        Behavior: Behavior normal.      Outpatient Encounter Medications as of 03/03/2023  Medication Sig   CALCIUM PO Take by mouth.   citalopram (CELEXA) 10 MG tablet TAKE 1 TABLET BY MOUTH DAILY   lisinopril (ZESTRIL) 10 MG tablet TAKE ONE TABLET BY MOUTH DAILY   LORazepam (ATIVAN) 0.5 MG tablet Take 1/2 tablet q day prn   omeprazole (PRILOSEC) 20 MG capsule Take 20 mg by mouth daily.   rosuvastatin (CRESTOR) 10 MG tablet TAKE 1 TABLET BY MOUTH DAILY   hydrocortisone-pramoxine (ANALPRAM HC) 2.5-1 % rectal cream Place 1 application rectally in the morning and at bedtime. (Patient not taking: Reported on 03/03/2023)   Omega-3 Fatty Acids (FISH OIL) 1000 MG CAPS Take 1,000 mg by mouth daily. (Patient not taking: Reported on 03/03/2023)   No facility-administered encounter medications on file as of 03/03/2023.     Lab Results  Component Value Date   WBC 6.9 03/15/2022   HGB 14.9 03/15/2022   HCT 45.4 03/15/2022   PLT 267.0 03/15/2022   GLUCOSE 104 (H) 02/27/2023   CHOL 158 02/27/2023   TRIG 124.0 02/27/2023   HDL 50.30 02/27/2023   LDLDIRECT 128.0 01/22/2019   LDLCALC 83 02/27/2023   ALT 22 02/27/2023   AST 21 02/27/2023   NA 141 02/27/2023   K 4.3 02/27/2023   CL 105 02/27/2023   CREATININE 0.68 02/27/2023   BUN 17 02/27/2023   CO2 30 02/27/2023   TSH 3.43 02/27/2023   HGBA1C 6.1 02/27/2023    MM 3D SCREENING MAMMOGRAM BILATERAL BREAST  Result Date: 11/23/2022 CLINICAL DATA:  Screening. EXAM: DIGITAL SCREENING BILATERAL MAMMOGRAM WITH TOMOSYNTHESIS AND CAD TECHNIQUE: Bilateral screening digital craniocaudal and mediolateral oblique mammograms were obtained. Bilateral screening digital breast tomosynthesis was performed. The images were evaluated with computer-aided detection. COMPARISON:  Previous exam(s). ACR Breast Density Category c: The breasts are heterogeneously dense, which may obscure small masses.  FINDINGS: There are no findings suspicious for malignancy. IMPRESSION: No mammographic evidence of malignancy. A result letter of this screening mammogram will be mailed directly to the patient. RECOMMENDATION: Screening mammogram in one year. (Code:SM-B-01Y) BI-RADS CATEGORY  1: Negative. Electronically Signed   By: Sherron Ales M.D.   On: 11/23/2022 17:15       Assessment & Plan:  There are no diagnoses linked to this encounter.   Dale [place], MD

## 2023-03-05 ENCOUNTER — Encounter: Payer: Self-pay | Admitting: Internal Medicine

## 2023-03-05 NOTE — Assessment & Plan Note (Signed)
Previous ultrasound with fatty liver.  Recent liver panel wnl.

## 2023-03-05 NOTE — Assessment & Plan Note (Signed)
Overall appears to be doing well.  Continue citalopram.  Follow.  

## 2023-03-05 NOTE — Assessment & Plan Note (Signed)
Low carb diet and exercise.  Follow met b and a1c.   Lab Results  Component Value Date   HGBA1C 6.1 02/27/2023

## 2023-03-05 NOTE — Assessment & Plan Note (Signed)
Continue lisinopril. Blood pressure as outlined.  Follow pressures.  Follow metabolic panel.  °

## 2023-03-05 NOTE — Assessment & Plan Note (Addendum)
Recent ultrasound reviewed.  Will follow.  Continue risk factor modification. Recommended f/u 5 years - last 2022.

## 2023-03-05 NOTE — Assessment & Plan Note (Signed)
Taking crestor 10mg  q day.  Low cholesterol diet and exercise.  Follow lipid panel and liver function tests.   Lab Results  Component Value Date   CHOL 158 02/27/2023   HDL 50.30 02/27/2023   LDLCALC 83 02/27/2023   LDLDIRECT 128.0 01/22/2019   TRIG 124.0 02/27/2023   CHOLHDL 3 02/27/2023

## 2023-03-13 ENCOUNTER — Ambulatory Visit: Payer: BC Managed Care – PPO | Admitting: Internal Medicine

## 2023-05-27 ENCOUNTER — Other Ambulatory Visit: Payer: Self-pay | Admitting: Internal Medicine

## 2023-06-26 ENCOUNTER — Telehealth: Payer: Self-pay | Admitting: Internal Medicine

## 2023-06-26 DIAGNOSIS — E7800 Pure hypercholesterolemia, unspecified: Secondary | ICD-10-CM

## 2023-06-26 DIAGNOSIS — R739 Hyperglycemia, unspecified: Secondary | ICD-10-CM

## 2023-06-26 DIAGNOSIS — I10 Essential (primary) hypertension: Secondary | ICD-10-CM

## 2023-06-26 NOTE — Telephone Encounter (Signed)
 Patient need lab orders.

## 2023-06-29 ENCOUNTER — Other Ambulatory Visit: Payer: BC Managed Care – PPO

## 2023-07-03 ENCOUNTER — Ambulatory Visit: Payer: BC Managed Care – PPO | Admitting: Internal Medicine

## 2023-07-05 ENCOUNTER — Other Ambulatory Visit

## 2023-07-07 ENCOUNTER — Ambulatory Visit: Admitting: Internal Medicine

## 2023-07-25 ENCOUNTER — Ambulatory Visit: Payer: BC Managed Care – PPO | Admitting: Dermatology

## 2023-09-22 ENCOUNTER — Telehealth: Payer: Self-pay | Admitting: Internal Medicine

## 2023-09-22 NOTE — Telephone Encounter (Signed)
 Lm and sent MyChart note,Dr Geralyn Knee will not be in the office on June 20th. Please call and reschedule your appointment.  E2C2 please reschedule appointment.

## 2023-10-06 ENCOUNTER — Other Ambulatory Visit: Payer: Self-pay | Admitting: Internal Medicine

## 2023-10-06 ENCOUNTER — Other Ambulatory Visit

## 2023-10-13 ENCOUNTER — Ambulatory Visit: Admitting: Internal Medicine

## 2023-12-04 ENCOUNTER — Other Ambulatory Visit (INDEPENDENT_AMBULATORY_CARE_PROVIDER_SITE_OTHER)

## 2023-12-04 ENCOUNTER — Ambulatory Visit: Payer: Self-pay | Admitting: Internal Medicine

## 2023-12-04 DIAGNOSIS — E7800 Pure hypercholesterolemia, unspecified: Secondary | ICD-10-CM | POA: Diagnosis not present

## 2023-12-04 DIAGNOSIS — I10 Essential (primary) hypertension: Secondary | ICD-10-CM | POA: Diagnosis not present

## 2023-12-04 DIAGNOSIS — R739 Hyperglycemia, unspecified: Secondary | ICD-10-CM

## 2023-12-04 LAB — CBC WITH DIFFERENTIAL/PLATELET
Basophils Absolute: 0.1 K/uL (ref 0.0–0.1)
Basophils Relative: 0.7 % (ref 0.0–3.0)
Eosinophils Absolute: 0.5 K/uL (ref 0.0–0.7)
Eosinophils Relative: 6.9 % — ABNORMAL HIGH (ref 0.0–5.0)
HCT: 45.4 % (ref 36.0–46.0)
Hemoglobin: 15.1 g/dL — ABNORMAL HIGH (ref 12.0–15.0)
Lymphocytes Relative: 29.3 % (ref 12.0–46.0)
Lymphs Abs: 2.3 K/uL (ref 0.7–4.0)
MCHC: 33.2 g/dL (ref 30.0–36.0)
MCV: 88.4 fl (ref 78.0–100.0)
Monocytes Absolute: 0.5 K/uL (ref 0.1–1.0)
Monocytes Relative: 6.9 % (ref 3.0–12.0)
Neutro Abs: 4.4 K/uL (ref 1.4–7.7)
Neutrophils Relative %: 56.2 % (ref 43.0–77.0)
Platelets: 260 K/uL (ref 150.0–400.0)
RBC: 5.13 Mil/uL — ABNORMAL HIGH (ref 3.87–5.11)
RDW: 13.2 % (ref 11.5–15.5)
WBC: 7.8 K/uL (ref 4.0–10.5)

## 2023-12-04 LAB — LIPID PANEL
Cholesterol: 168 mg/dL (ref 0–200)
HDL: 51.8 mg/dL (ref >39.00)
LDL Cholesterol: 91 mg/dL (ref 0–99)
NonHDL: 116.56
Total CHOL/HDL Ratio: 3
Triglycerides: 130 mg/dL (ref 0.0–149.0)
VLDL: 26 mg/dL (ref 0.0–40.0)

## 2023-12-04 LAB — HEMOGLOBIN A1C: Hgb A1c MFr Bld: 6.4 % (ref 4.6–6.5)

## 2023-12-04 LAB — BASIC METABOLIC PANEL WITH GFR
BUN: 16 mg/dL (ref 6–23)
CO2: 28 meq/L (ref 19–32)
Calcium: 9.2 mg/dL (ref 8.4–10.5)
Chloride: 105 meq/L (ref 96–112)
Creatinine, Ser: 0.65 mg/dL (ref 0.40–1.20)
GFR: 93.15 mL/min (ref >60.00)
Glucose, Bld: 97 mg/dL (ref 70–99)
Potassium: 4 meq/L (ref 3.5–5.1)
Sodium: 141 meq/L (ref 135–145)

## 2023-12-04 LAB — HEPATIC FUNCTION PANEL
ALT: 21 U/L (ref 0–35)
AST: 17 U/L (ref 0–37)
Albumin: 4.4 g/dL (ref 3.5–5.2)
Alkaline Phosphatase: 85 U/L (ref 39–117)
Bilirubin, Direct: 0.1 mg/dL (ref 0.0–0.3)
Total Bilirubin: 0.6 mg/dL (ref 0.2–1.2)
Total Protein: 6.5 g/dL (ref 6.0–8.3)

## 2023-12-11 ENCOUNTER — Ambulatory Visit (INDEPENDENT_AMBULATORY_CARE_PROVIDER_SITE_OTHER): Admitting: Internal Medicine

## 2023-12-11 VITALS — BP 110/68 | HR 70 | Resp 16 | Ht 64.0 in | Wt 148.0 lb

## 2023-12-11 DIAGNOSIS — F439 Reaction to severe stress, unspecified: Secondary | ICD-10-CM

## 2023-12-11 DIAGNOSIS — I10 Essential (primary) hypertension: Secondary | ICD-10-CM | POA: Diagnosis not present

## 2023-12-11 DIAGNOSIS — I77811 Abdominal aortic ectasia: Secondary | ICD-10-CM

## 2023-12-11 DIAGNOSIS — R739 Hyperglycemia, unspecified: Secondary | ICD-10-CM

## 2023-12-11 DIAGNOSIS — Z1231 Encounter for screening mammogram for malignant neoplasm of breast: Secondary | ICD-10-CM

## 2023-12-11 DIAGNOSIS — E7800 Pure hypercholesterolemia, unspecified: Secondary | ICD-10-CM

## 2023-12-11 DIAGNOSIS — E559 Vitamin D deficiency, unspecified: Secondary | ICD-10-CM

## 2023-12-11 MED ORDER — LISINOPRIL 10 MG PO TABS: 10.0000 mg | ORAL_TABLET | Freq: Every day | ORAL | 1 refills | Status: DC

## 2023-12-11 MED ORDER — CITALOPRAM HYDROBROMIDE 10 MG PO TABS: 10.0000 mg | ORAL_TABLET | Freq: Every day | ORAL | 1 refills | Status: DC

## 2023-12-11 MED ORDER — ROSUVASTATIN CALCIUM 10 MG PO TABS: 10.0000 mg | ORAL_TABLET | Freq: Every day | ORAL | 3 refills | Status: DC

## 2023-12-11 NOTE — Progress Notes (Signed)
 Subjective:    Patient ID: Tammy Daniels, female    DOB: 08/27/1960, 63 y.o.   MRN: [id number]  Patient here for  Chief Complaint  Patient presents with   Medical Management of Chronic Issues    HPI Here for a scheduled follow up - f/u regarding her blood pressure, cholesterol and increased stress. Continues on lisinopril . Traveling. Staying active. No chest pain or sob reported. No abdominal pain or bowel change reported.    Past Medical History:  Diagnosis Date   Allergic rhinitis    Elevated blood pressure    Hypercholesterolemia    Past Surgical History:  Procedure Laterality Date   DILATION AND CURETTAGE, DIAGNOSTIC / THERAPEUTIC  1992   explaratory lap  1992   HYSTEROSCOPY  1997   with cervical polyp removal   Family History  Problem Relation Age of Onset   Hyperlipidemia Mother    Hypertension Mother    Heart disease Father        died 25 MI   Hypothyroidism Sister    Hypertension Brother    Breast cancer Neg Hx    Social History   Socioeconomic History   Marital status: Married    Spouse name: Not on file   Number of children: 3   Years of education: Not on file   Highest education level: Not on file  Occupational History    Employer: [employer]  Tobacco Use   Smoking status: Never   Smokeless tobacco: Never  Substance and Sexual Activity   Alcohol use: Yes    Alcohol/week: 0.0 standard drinks of alcohol   Drug use: No   Sexual activity: Not on file  Other Topics Concern   Not on file  Social History Narrative   Not on file   Social Drivers of Health   Financial Resource Strain: Not on file  Food Insecurity: Not on file  Transportation Needs: Not on file  Physical Activity: Not on file  Stress: Not on file  Social Connections: Not on file     Review of Systems  Constitutional:  Negative for appetite change and unexpected weight change.  HENT:  Negative for congestion and sinus pressure.   Respiratory:  Negative for cough,  chest tightness and shortness of breath.   Cardiovascular:  Negative for chest pain, palpitations and leg swelling.  Gastrointestinal:  Negative for abdominal pain, diarrhea, nausea and vomiting.  Genitourinary:  Negative for difficulty urinating and dysuria.  Musculoskeletal:  Negative for joint swelling and myalgias.  Skin:  Negative for color change and rash.  Neurological:  Negative for dizziness and headaches.  Psychiatric/Behavioral:  Negative for agitation.        Objective:     BP 110/68   Pulse 70   Resp 16   Ht 5' 4 (1.626 m)   Wt 148 lb (67.1 kg)   SpO2 98%   BMI 25.40 kg/m  Wt Readings from Last 3 Encounters:  12/11/23 148 lb (67.1 kg)  03/03/23 151 lb 6.4 oz (68.7 kg)  07/25/22 151 lb (68.5 kg)    Physical Exam Vitals reviewed.  Constitutional:      General: She is not in acute distress.    Appearance: Normal appearance.  HENT:     Head: Normocephalic and atraumatic.     Right Ear: External ear normal.     Left Ear: External ear normal.     Mouth/Throat:     Pharynx: No oropharyngeal exudate or posterior oropharyngeal erythema.  Eyes:  General: No scleral icterus.       Right eye: No discharge.        Left eye: No discharge.     Conjunctiva/sclera: Conjunctivae normal.  Neck:     Thyroid : No thyromegaly.  Cardiovascular:     Rate and Rhythm: Normal rate and regular rhythm.  Pulmonary:     Effort: No respiratory distress.     Breath sounds: Normal breath sounds. No wheezing.  Abdominal:     General: Bowel sounds are normal.     Palpations: Abdomen is soft.     Tenderness: There is no abdominal tenderness.  Musculoskeletal:        General: No swelling or tenderness.     Cervical back: Neck supple. No tenderness.  Lymphadenopathy:     Cervical: No cervical adenopathy.  Skin:    Findings: No erythema or rash.  Neurological:     Mental Status: She is alert.  Psychiatric:        Mood and Affect: Mood normal.        Behavior: Behavior  normal.         Outpatient Encounter Medications as of 12/11/2023  Medication Sig   CALCIUM  PO Take by mouth.   citalopram  (CELEXA ) 10 MG tablet Take 1 tablet (10 mg total) by mouth daily.   lisinopril  (ZESTRIL ) 10 MG tablet Take 1 tablet (10 mg total) by mouth daily.   LORazepam  (ATIVAN ) 0.5 MG tablet Take 1/2 tablet q day prn   omeprazole (PRILOSEC) 20 MG capsule Take 20 mg by mouth daily.   rosuvastatin  (CRESTOR ) 10 MG tablet Take 1 tablet (10 mg total) by mouth daily.   [DISCONTINUED] citalopram  (CELEXA ) 10 MG tablet TAKE 1 TABLET BY MOUTH DAILY   [DISCONTINUED] lisinopril  (ZESTRIL ) 10 MG tablet TAKE 1 TABLET BY MOUTH DAILY   [DISCONTINUED] rosuvastatin  (CRESTOR ) 10 MG tablet TAKE 1 TABLET BY MOUTH DAILY   No facility-administered encounter medications on file as of 12/11/2023.     Lab Results  Component Value Date   WBC 7.8 12/04/2023   HGB 15.1 (H) 12/04/2023   HCT 45.4 12/04/2023   PLT 260.0 12/04/2023   GLUCOSE 97 12/04/2023   CHOL 168 12/04/2023   TRIG 130.0 12/04/2023   HDL 51.80 12/04/2023   LDLDIRECT 128.0 01/22/2019   LDLCALC 91 12/04/2023   ALT 21 12/04/2023   AST 17 12/04/2023   NA 141 12/04/2023   K 4.0 12/04/2023   CL 105 12/04/2023   CREATININE 0.65 12/04/2023   BUN 16 12/04/2023   CO2 28 12/04/2023   TSH 3.43 02/27/2023   HGBA1C 6.4 12/04/2023    MM 3D SCREENING MAMMOGRAM BILATERAL BREAST Result Date: 11/23/2022 CLINICAL DATA:  Screening. EXAM: DIGITAL SCREENING BILATERAL MAMMOGRAM WITH TOMOSYNTHESIS AND CAD TECHNIQUE: Bilateral screening digital craniocaudal and mediolateral oblique mammograms were obtained. Bilateral screening digital breast tomosynthesis was performed. The images were evaluated with computer-aided detection. COMPARISON:  Previous exam(s). ACR Breast Density Category c: The breasts are heterogeneously dense, which may obscure small masses. FINDINGS: There are no findings suspicious for malignancy. IMPRESSION: No mammographic evidence  of malignancy. A result letter of this screening mammogram will be mailed directly to the patient. RECOMMENDATION: Screening mammogram in one year. (Code:SM-B-01Y) BI-RADS CATEGORY  1: Negative. Electronically Signed   By: Leita Mattocks M.D.   On: 11/23/2022 17:15       Assessment & Plan:  Hypercholesterolemia Assessment & Plan: Taking crestor  10mg  q day.  Low cholesterol diet and exercise.  Follow lipid panel  and liver function tests.  Discussed recent labs.  Lab Results  Component Value Date   CHOL 168 12/04/2023   HDL 51.80 12/04/2023   LDLCALC 91 12/04/2023   LDLDIRECT 128.0 01/22/2019   TRIG 130.0 12/04/2023   CHOLHDL 3 12/04/2023    Orders: -     Lipid panel; Future -     Hepatic function panel; Future  Visit for screening mammogram -     3D Screening Mammogram, Left and Right; Future  Essential hypertension Assessment & Plan: Continue lisinopril . Blood pressures as outlined. Follow pressures. Follow metabolic panel.   Orders: -     Basic metabolic panel with GFR; Future -     TSH; Future  Hyperglycemia Assessment & Plan: Low carb diet and exercise.  Follow met b and A1c.  Lab Results  Component Value Date   HGBA1C 6.4 12/04/2023    Orders: -     Hemoglobin A1c; Future  Abdominal aortic ectasia (HCC) Assessment & Plan: Continue risk factor modification. Recommended f/u 5 years - last 2022.    Vitamin D deficiency Assessment & Plan: Follow vitamin D level.    Stress Assessment & Plan: Overall doing well. Continue citalopram .    Other orders -     Citalopram  Hydrobromide; Take 1 tablet (10 mg total) by mouth daily.  Dispense: 90 tablet; Refill: 1 -     Lisinopril ; Take 1 tablet (10 mg total) by mouth daily.  Dispense: 90 tablet; Refill: 1 -     Rosuvastatin  Calcium ; Take 1 tablet (10 mg total) by mouth daily.  Dispense: 90 tablet; Refill: 3     Allena Hamilton, MD

## 2023-12-11 NOTE — Patient Instructions (Signed)
 YOUR MAMMOGRAM IS DUE, PLEASE CALL AND GET THIS SCHEDULED! [facility] - call [phone]

## 2023-12-11 NOTE — Assessment & Plan Note (Signed)
 Taking crestor  10mg  q day.  Low cholesterol diet and exercise.  Follow lipid panel and liver function tests.  Discussed recent labs.  Lab Results  Component Value Date   CHOL 168 12/04/2023   HDL 51.80 12/04/2023   LDLCALC 91 12/04/2023   LDLDIRECT 128.0 01/22/2019   TRIG 130.0 12/04/2023   CHOLHDL 3 12/04/2023

## 2023-12-16 ENCOUNTER — Encounter: Payer: Self-pay | Admitting: Internal Medicine

## 2023-12-16 NOTE — Assessment & Plan Note (Signed)
Follow vitamin D level.  

## 2023-12-16 NOTE — Assessment & Plan Note (Signed)
 Continue risk factor modification. Recommended f/u 5 years - last 2022.

## 2023-12-16 NOTE — Assessment & Plan Note (Signed)
 Overall doing well. Continue citalopram .

## 2023-12-16 NOTE — Assessment & Plan Note (Signed)
 Low carb diet and exercise.  Follow met b and A1c.  Lab Results  Component Value Date   HGBA1C 6.4 12/04/2023

## 2023-12-16 NOTE — Assessment & Plan Note (Signed)
Continue lisinopril.  Blood pressures as outlined.  Follow pressures.  Follow metabolic panel.  

## 2024-03-20 ENCOUNTER — Ambulatory Visit
Admission: RE | Admit: 2024-03-20 | Discharge: 2024-03-20 | Disposition: A | Discharge status: Home / Self Care | Source: Ambulatory Visit | Attending: Internal Medicine | Admitting: Internal Medicine

## 2024-03-20 DIAGNOSIS — Z1231 Encounter for screening mammogram for malignant neoplasm of breast: Secondary | ICD-10-CM | POA: Diagnosis not present

## 2024-03-26 ENCOUNTER — Other Ambulatory Visit: Payer: Self-pay | Admitting: Internal Medicine

## 2024-03-26 DIAGNOSIS — R928 Other abnormal and inconclusive findings on diagnostic imaging of breast: Secondary | ICD-10-CM

## 2024-03-27 ENCOUNTER — Inpatient Hospital Stay: Admission: RE | Admit: 2024-03-27 | Discharge: 2024-03-27 | Attending: Internal Medicine | Admitting: Internal Medicine

## 2024-03-27 DIAGNOSIS — R921 Mammographic calcification found on diagnostic imaging of breast: Secondary | ICD-10-CM | POA: Diagnosis not present

## 2024-03-27 DIAGNOSIS — R928 Other abnormal and inconclusive findings on diagnostic imaging of breast: Secondary | ICD-10-CM | POA: Insufficient documentation

## 2024-03-27 DIAGNOSIS — R92333 Mammographic heterogeneous density, bilateral breasts: Secondary | ICD-10-CM | POA: Diagnosis not present

## 2024-03-28 ENCOUNTER — Other Ambulatory Visit: Payer: Self-pay | Admitting: Internal Medicine

## 2024-03-28 ENCOUNTER — Ambulatory Visit: Payer: Self-pay | Admitting: Internal Medicine

## 2024-03-28 DIAGNOSIS — R928 Other abnormal and inconclusive findings on diagnostic imaging of breast: Secondary | ICD-10-CM

## 2024-04-02 ENCOUNTER — Inpatient Hospital Stay: Admission: RE | Admit: 2024-04-02 | Discharge: 2024-04-02 | Attending: Internal Medicine | Admitting: Internal Medicine

## 2024-04-02 DIAGNOSIS — R921 Mammographic calcification found on diagnostic imaging of breast: Secondary | ICD-10-CM | POA: Diagnosis not present

## 2024-04-02 DIAGNOSIS — R928 Other abnormal and inconclusive findings on diagnostic imaging of breast: Secondary | ICD-10-CM

## 2024-04-02 DIAGNOSIS — F439 Reaction to severe stress, unspecified: Secondary | ICD-10-CM

## 2024-04-02 HISTORY — PX: BREAST BIOPSY: SHX20

## 2024-04-02 MED ORDER — LIDOCAINE-EPINEPHRINE 1 %-1:100000 IJ SOLN
20.0000 mL | Freq: Once | INTRAMUSCULAR | Status: AC
  Administered 2024-04-02: 20 mL
  Filled 2024-04-02: qty 20

## 2024-04-02 MED ORDER — LIDOCAINE HCL 1 % IJ SOLN
5.0000 mL | Freq: Once | INTRAMUSCULAR | Status: AC
  Administered 2024-04-02: 5 mL
  Filled 2024-04-02: qty 5

## 2024-04-02 MED ORDER — LIDOCAINE 1 % OPTIME INJ - NO CHARGE
5.0000 mL | Freq: Once | INTRAMUSCULAR | Status: AC
  Administered 2024-04-02: 5 mL
  Filled 2024-04-02: qty 6

## 2024-04-03 ENCOUNTER — Ambulatory Visit: Payer: Self-pay | Admitting: Internal Medicine

## 2024-04-03 LAB — SURGICAL PATHOLOGY

## 2024-04-04 ENCOUNTER — Other Ambulatory Visit

## 2024-04-05 ENCOUNTER — Other Ambulatory Visit (INDEPENDENT_AMBULATORY_CARE_PROVIDER_SITE_OTHER)

## 2024-04-05 DIAGNOSIS — I10 Essential (primary) hypertension: Secondary | ICD-10-CM | POA: Diagnosis not present

## 2024-04-05 DIAGNOSIS — R739 Hyperglycemia, unspecified: Secondary | ICD-10-CM | POA: Diagnosis not present

## 2024-04-05 DIAGNOSIS — E7800 Pure hypercholesterolemia, unspecified: Secondary | ICD-10-CM | POA: Diagnosis not present

## 2024-04-05 LAB — HEPATIC FUNCTION PANEL
ALT: 18 U/L (ref 0–35)
AST: 16 U/L (ref 0–37)
Albumin: 4.4 g/dL (ref 3.5–5.2)
Alkaline Phosphatase: 83 U/L (ref 39–117)
Bilirubin, Direct: 0.1 mg/dL (ref 0.0–0.3)
Total Bilirubin: 0.5 mg/dL (ref 0.2–1.2)
Total Protein: 6.7 g/dL (ref 6.0–8.3)

## 2024-04-05 LAB — BASIC METABOLIC PANEL WITH GFR
BUN: 20 mg/dL (ref 6–23)
CO2: 29 meq/L (ref 19–32)
Calcium: 9.3 mg/dL (ref 8.4–10.5)
Chloride: 107 meq/L (ref 96–112)
Creatinine, Ser: 0.74 mg/dL (ref 0.40–1.20)
GFR: 85.39 mL/min (ref >60.00)
Glucose, Bld: 92 mg/dL (ref 70–99)
Potassium: 4.3 meq/L (ref 3.5–5.1)
Sodium: 143 meq/L (ref 135–145)

## 2024-04-05 LAB — LIPID PANEL
Cholesterol: 152 mg/dL (ref 0–200)
HDL: 52.4 mg/dL (ref >39.00)
LDL Cholesterol: 78 mg/dL (ref 0–99)
NonHDL: 99.43
Total CHOL/HDL Ratio: 3
Triglycerides: 109 mg/dL (ref 0.0–149.0)
VLDL: 21.8 mg/dL (ref 0.0–40.0)

## 2024-04-05 LAB — HEMOGLOBIN A1C: Hgb A1c MFr Bld: 6 % (ref 4.6–6.5)

## 2024-04-05 LAB — TSH: TSH: 3.31 u[IU]/mL (ref 0.35–5.50)

## 2024-04-06 ENCOUNTER — Ambulatory Visit: Payer: Self-pay | Admitting: Internal Medicine

## 2024-04-09 ENCOUNTER — Telehealth: Payer: Self-pay | Admitting: Internal Medicine

## 2024-04-09 NOTE — Telephone Encounter (Signed)
 Lm  and sent MyChart message to see if patient could come into the office today, 04/09/2024 at 3::30 to see Dr Loraine. Office will hold slot until 9am.

## 2024-04-11 ENCOUNTER — Ambulatory Visit: Admitting: Internal Medicine

## 2024-04-11 ENCOUNTER — Encounter: Payer: Self-pay | Admitting: Internal Medicine

## 2024-04-11 VITALS — BP 118/70 | HR 60 | Temp 98.4°F | Ht 64.0 in | Wt 150.6 lb

## 2024-04-11 DIAGNOSIS — F439 Reaction to severe stress, unspecified: Secondary | ICD-10-CM

## 2024-04-11 DIAGNOSIS — I10 Essential (primary) hypertension: Secondary | ICD-10-CM

## 2024-04-11 DIAGNOSIS — E7800 Pure hypercholesterolemia, unspecified: Secondary | ICD-10-CM | POA: Diagnosis not present

## 2024-04-11 DIAGNOSIS — R739 Hyperglycemia, unspecified: Secondary | ICD-10-CM

## 2024-04-11 DIAGNOSIS — Z0000 Encounter for general adult medical examination without abnormal findings: Secondary | ICD-10-CM

## 2024-04-11 DIAGNOSIS — E559 Vitamin D deficiency, unspecified: Secondary | ICD-10-CM | POA: Diagnosis not present

## 2024-04-11 DIAGNOSIS — Z1211 Encounter for screening for malignant neoplasm of colon: Secondary | ICD-10-CM

## 2024-04-11 DIAGNOSIS — I77811 Abdominal aortic ectasia: Secondary | ICD-10-CM | POA: Diagnosis not present

## 2024-04-11 MED ORDER — ROSUVASTATIN CALCIUM 10 MG PO TABS: 10.0000 mg | ORAL_TABLET | Freq: Every day | ORAL | 3 refills | Status: AC

## 2024-04-11 MED ORDER — CITALOPRAM HYDROBROMIDE 10 MG PO TABS: 10.0000 mg | ORAL_TABLET | Freq: Every day | ORAL | 1 refills | Status: AC

## 2024-04-11 MED ORDER — LISINOPRIL 10 MG PO TABS: 10.0000 mg | ORAL_TABLET | Freq: Every day | ORAL | 1 refills | Status: AC

## 2024-04-11 NOTE — Progress Notes (Signed)
 "  Subjective:    Patient ID: Tammy Daniels, female    DOB: 08/31/1960, 63 y.o.   MRN: [id number]  Patient here for physical exam.   HPI Here for a physical exam. Stays active. Traveling. No chest pain or sob with increased activity or exertion. No abdominal pain. Handling stress. Blood pressure doing well.    Past Medical History:  Diagnosis Date   Allergic rhinitis    Elevated blood pressure    Hypercholesterolemia    Past Surgical History:  Procedure Laterality Date   BREAST BIOPSY Right 04/02/2024   stereo rt calcs 1st site ribbon path pending   BREAST BIOPSY Right 04/02/2024   stereo rt calcs 2st site coil path pending   BREAST BIOPSY Right 04/02/2024   MM RT BREAST BX W LOC DEV 1ST LESION IMAGE BX SPEC STEREO GUIDE 04/02/2024 [facility]   BREAST BIOPSY Right 04/02/2024   MM RT BREAST BX W LOC DEV EA AD LESION IMG BX SPEC STEREO GUIDE 04/02/2024 [facility]   DILATION AND CURETTAGE, DIAGNOSTIC / THERAPEUTIC  04/25/1990   explaratory lap  04/25/1990   HYSTEROSCOPY  04/26/1995   with cervical polyp removal   Family History  Problem Relation Age of Onset   Hyperlipidemia Mother    Hypertension Mother    Heart disease Father        died 36 MI   Hypothyroidism Sister    Hypertension Brother    Breast cancer Neg Hx    Social History   Socioeconomic History   Marital status: Married    Spouse name: Not on file   Number of children: 3   Years of education: Not on file   Highest education level: Not on file  Occupational History    Employer: [employer]  Tobacco Use   Smoking status: Never   Smokeless tobacco: Never  Substance and Sexual Activity   Alcohol use: Yes    Alcohol/week: 0.0 standard drinks of alcohol   Drug use: No   Sexual activity: Not on file  Other Topics Concern   Not on file  Social History Narrative   Not on file   Social Drivers of Health   Tobacco Use: Low Risk (04/20/2024)   Patient History    Smoking Tobacco Use:  Never    Smokeless Tobacco Use: Never    Passive Exposure: Not on file  Financial Resource Strain: Not on file  Food Insecurity: Not on file  Transportation Needs: Not on file  Physical Activity: Not on file  Stress: Not on file  Social Connections: Not on file  Depression (PHQ2-9): Low Risk (04/11/2024)   Depression (PHQ2-9)    PHQ-2 Score: 0  Alcohol Screen: Not on file  Housing: Not on file  Utilities: Not on file  Health Literacy: Not on file     Review of Systems  Constitutional:  Negative for appetite change and unexpected weight change.  HENT:  Negative for congestion, sinus pressure and sore throat.   Eyes:  Negative for pain and visual disturbance.  Respiratory:  Negative for cough, chest tightness and shortness of breath.   Cardiovascular:  Negative for chest pain, palpitations and leg swelling.  Gastrointestinal:  Negative for abdominal pain, constipation, nausea and vomiting.       Occastional diarrhea.   Genitourinary:  Negative for difficulty urinating and dysuria.  Musculoskeletal:  Negative for joint swelling and myalgias.  Skin:  Negative for color change and rash.  Neurological:  Negative for dizziness and headaches.  Hematological:  Negative for adenopathy. Does not bruise/bleed easily.  Psychiatric/Behavioral:  Negative for agitation and dysphoric mood.        Objective:     BP 118/70   Pulse 60   Temp 98.4 F (36.9 C) (Oral)   Ht 5' 4 (1.626 m)   Wt 150 lb 9.6 oz (68.3 kg)   SpO2 97%   BMI 25.85 kg/m  Wt Readings from Last 3 Encounters:  04/11/24 150 lb 9.6 oz (68.3 kg)  12/11/23 148 lb (67.1 kg)  03/03/23 151 lb 6.4 oz (68.7 kg)    Physical Exam Vitals reviewed.  Constitutional:      General: She is not in acute distress.    Appearance: Normal appearance. She is well-developed.  HENT:     Head: Normocephalic and atraumatic.     Right Ear: External ear normal.     Left Ear: External ear normal.     Mouth/Throat:     Pharynx: No  oropharyngeal exudate or posterior oropharyngeal erythema.  Eyes:     General: No scleral icterus.       Right eye: No discharge.        Left eye: No discharge.     Conjunctiva/sclera: Conjunctivae normal.  Neck:     Thyroid : No thyromegaly.  Cardiovascular:     Rate and Rhythm: Normal rate and regular rhythm.  Pulmonary:     Effort: No tachypnea, accessory muscle usage or respiratory distress.     Breath sounds: Normal breath sounds. No decreased breath sounds or wheezing.  Chest:  Breasts:    Right: No inverted nipple, mass, nipple discharge or tenderness (no axillary adenopathy).     Left: No inverted nipple, mass, nipple discharge or tenderness (no axilarry adenopathy).  Abdominal:     General: Bowel sounds are normal.     Palpations: Abdomen is soft.     Tenderness: There is no abdominal tenderness.  Musculoskeletal:        General: No swelling or tenderness.     Cervical back: Neck supple.  Lymphadenopathy:     Cervical: No cervical adenopathy.  Skin:    Findings: No erythema or rash.  Neurological:     Mental Status: She is alert and oriented to person, place, and time.  Psychiatric:        Mood and Affect: Mood normal.        Behavior: Behavior normal.         Outpatient Encounter Medications as of 04/11/2024  Medication Sig   CALCIUM  PO Take by mouth.   LORazepam  (ATIVAN ) 0.5 MG tablet Take 1/2 tablet q day prn   omeprazole (PRILOSEC) 20 MG capsule Take 20 mg by mouth daily.   [DISCONTINUED] VENTOLIN HFA 108 (90 Base) MCG/ACT inhaler Inhale into the lungs.   citalopram  (CELEXA ) 10 MG tablet Take 1 tablet (10 mg total) by mouth daily.   lisinopril  (ZESTRIL ) 10 MG tablet Take 1 tablet (10 mg total) by mouth daily.   rosuvastatin  (CRESTOR ) 10 MG tablet Take 1 tablet (10 mg total) by mouth daily.   [DISCONTINUED] citalopram  (CELEXA ) 10 MG tablet Take 1 tablet (10 mg total) by mouth daily.   [DISCONTINUED] lisinopril  (ZESTRIL ) 10 MG tablet Take 1 tablet (10 mg  total) by mouth daily.   [DISCONTINUED] rosuvastatin  (CRESTOR ) 10 MG tablet Take 1 tablet (10 mg total) by mouth daily.   No facility-administered encounter medications on file as of 04/11/2024.     Lab Results  Component Value Date  WBC 7.8 12/04/2023   HGB 15.1 (H) 12/04/2023   HCT 45.4 12/04/2023   PLT 260.0 12/04/2023   GLUCOSE 92 04/05/2024   CHOL 152 04/05/2024   TRIG 109.0 04/05/2024   HDL 52.40 04/05/2024   LDLDIRECT 128.0 01/22/2019   LDLCALC 78 04/05/2024   ALT 18 04/05/2024   AST 16 04/05/2024   NA 143 04/05/2024   K 4.3 04/05/2024   CL 107 04/05/2024   CREATININE 0.74 04/05/2024   BUN 20 04/05/2024   CO2 29 04/05/2024   TSH 3.31 04/05/2024   HGBA1C 6.0 04/05/2024    MM RT BREAST BX W LOC DEV 1ST LESION IMAGE BX SPEC STEREO GUIDE Addendum Date: 04/03/2024 ADDENDUM REPORT: 04/03/2024 12:36 ADDENDUM: At the time of biopsy, the patient alerted us  to a small volume of expressed yellow discharge at her nipple which was manually expressed with compression during the biopsy. Patient states this has never happened before. Advised patient that we could not evaluate the finding the day of biopsy due to the presence of air and post biopsy changes s/p double stereo biopsy, and that a single episode of manually expressed yellow discharge is likely physiologic in nature. Advised patient that should the discharge occur again, or if it becomes spontaneous or clear/bloody, she should alert her PCP so that a formal evaluation can be performed with mammogram and ultrasound. If desired, we could perform that evaluation at this time, but given that a single episode of non spontaneous yellow discharge sounds physiologic in nature, it would be reasonable to defer workup at this time unless symptoms persist or change in a concerning way. Patient encouraged to contact PCP if she opts for workup at this time. Electronically Signed   By: Norleen Croak M.D.   On: 04/03/2024 12:36   Addendum Date:  04/03/2024 ADDENDUM REPORT: 04/03/2024 11:10 ADDENDUM: PATHOLOGY revealed: Site 1. Breast, right, needle core biopsy, LIQ 4 mm (ribbon clip)- FIBROCYSTIC CHANGES WITH APOCRINE METAPLASIA AND CALCIFICATIONS. NEGATIVE FOR ATYPIA AND MALIGNANCY. Pathology results are CONCORDANT with imaging findings, per Dr. Norleen Croak. PATHOLOGY revealed: Site 2. Breast, right, needle core biopsy, UOQ 4 mm (coil clip)- FIBROCYSTIC CHANGES WITH CALCIFICATIONS. NEGATIVE FOR ATYPIA MALIGNANCY. Pathology results are CONCORDANT with imaging findings, per Dr. Norleen Croak. Pathology results and recommendations below were discussed with patient by telephone on 04/03/2024. Patient reported biopsy site within normal limits with slight tenderness at the site. Post biopsy care instructions were reviewed, questions were answered and my direct phone number was provided to patient. Patient was instructed to call [facility] if any concerns or questions arise related to the biopsy. RECOMMENDATION: Patient instructed to resume annual bilateral screening mammogram due November 2026. Pathology results reported by Mliss CHARM Molt RN 04/03/2024. Electronically Signed   By: Norleen Croak M.D.   On: 04/03/2024 11:10   Result Date: 04/03/2024 CLINICAL DATA:  Indeterminate RIGHT breast calcifications EXAM: RIGHT BREAST STEREOTACTIC CORE NEEDLE BIOPSY x2 COMPARISON:  Previous exam(s). FINDINGS: The patient and I discussed the procedure of stereotactic-guided biopsy including benefits and alternatives. We discussed the high likelihood of a successful procedure. We discussed the risks of the procedure including infection, bleeding, tissue injury, clip migration, and inadequate sampling. Informed written consent was given. The usual time out protocol was performed immediately prior to the procedure. Site 1: RIGHT breast calcifications, lower inner quadrant Using sterile technique and 1% lidocaine  and 1% lidocaine  with epinephrine  as local  anesthetic, under stereotactic guidance, a 9 gauge vacuum assisted device was used to  perform core needle biopsy of RIGHT breast calcifications in the lower inner quadrant using a MEDIAL approach. Specimen radiograph was performed showing representative calcifications. Specimens with calcifications are identified for pathology. Lesion quadrant: Lower inner quadrant At the conclusion of the procedure, ribbon-shaped tissue marker clip was deployed into the biopsy cavity. Site 2: RIGHT breast calcifications, upper-outer quadrant Using sterile technique and 1% lidocaine  and 1% lidocaine  with epinephrine  as local anesthetic, under stereotactic guidance, a 9 gauge vacuum assisted device was used to perform core needle biopsy of RIGHT breast calcifications in the upper-outer quadrant using a superior approach. Specimen radiograph was performed showing representative calcifications. Specimens with calcifications are identified for pathology. Lesion quadrant: Upper-outer At the conclusion of the procedure, coil-shaped tissue marker clip was deployed into the biopsy cavity. Follow-up 2-view mammogram was performed and dictated separately. IMPRESSION: Stereotactic-guided biopsy of RIGHT breast calcifications in the upper-outer quadrant and lower inner quadrant. No apparent complications. Electronically Signed: By: Norleen Croak M.D. On: 04/02/2024 14:00   MM RT BREAST BX W LOC DEV EA AD LESION IMG BX SPEC STEREO GUIDE Addendum Date: 04/03/2024 ADDENDUM REPORT: 04/03/2024 12:36 ADDENDUM: At the time of biopsy, the patient alerted us  to a small volume of expressed yellow discharge at her nipple which was manually expressed with compression during the biopsy. Patient states this has never happened before. Advised patient that we could not evaluate the finding the day of biopsy due to the presence of air and post biopsy changes s/p double stereo biopsy, and that a single episode of manually expressed yellow discharge is likely  physiologic in nature. Advised patient that should the discharge occur again, or if it becomes spontaneous or clear/bloody, she should alert her PCP so that a formal evaluation can be performed with mammogram and ultrasound. If desired, we could perform that evaluation at this time, but given that a single episode of non spontaneous yellow discharge sounds physiologic in nature, it would be reasonable to defer workup at this time unless symptoms persist or change in a concerning way. Patient encouraged to contact PCP if she opts for workup at this time. Electronically Signed   By: Norleen Croak M.D.   On: 04/03/2024 12:36   Addendum Date: 04/03/2024 ADDENDUM REPORT: 04/03/2024 11:10 ADDENDUM: PATHOLOGY revealed: Site 1. Breast, right, needle core biopsy, LIQ 4 mm (ribbon clip)- FIBROCYSTIC CHANGES WITH APOCRINE METAPLASIA AND CALCIFICATIONS. NEGATIVE FOR ATYPIA AND MALIGNANCY. Pathology results are CONCORDANT with imaging findings, per Dr. Norleen Croak. PATHOLOGY revealed: Site 2. Breast, right, needle core biopsy, UOQ 4 mm (coil clip)- FIBROCYSTIC CHANGES WITH CALCIFICATIONS. NEGATIVE FOR ATYPIA MALIGNANCY. Pathology results are CONCORDANT with imaging findings, per Dr. Norleen Croak. Pathology results and recommendations below were discussed with patient by telephone on 04/03/2024. Patient reported biopsy site within normal limits with slight tenderness at the site. Post biopsy care instructions were reviewed, questions were answered and my direct phone number was provided to patient. Patient was instructed to call [facility] if any concerns or questions arise related to the biopsy. RECOMMENDATION: Patient instructed to resume annual bilateral screening mammogram due November 2026. Pathology results reported by Mliss CHARM Molt RN 04/03/2024. Electronically Signed   By: Norleen Croak M.D.   On: 04/03/2024 11:10   Result Date: 04/03/2024 CLINICAL DATA:  Indeterminate RIGHT breast calcifications EXAM:  RIGHT BREAST STEREOTACTIC CORE NEEDLE BIOPSY x2 COMPARISON:  Previous exam(s). FINDINGS: The patient and I discussed the procedure of stereotactic-guided biopsy including benefits and alternatives. We discussed the high likelihood of  a successful procedure. We discussed the risks of the procedure including infection, bleeding, tissue injury, clip migration, and inadequate sampling. Informed written consent was given. The usual time out protocol was performed immediately prior to the procedure. Site 1: RIGHT breast calcifications, lower inner quadrant Using sterile technique and 1% lidocaine  and 1% lidocaine  with epinephrine  as local anesthetic, under stereotactic guidance, a 9 gauge vacuum assisted device was used to perform core needle biopsy of RIGHT breast calcifications in the lower inner quadrant using a MEDIAL approach. Specimen radiograph was performed showing representative calcifications. Specimens with calcifications are identified for pathology. Lesion quadrant: Lower inner quadrant At the conclusion of the procedure, ribbon-shaped tissue marker clip was deployed into the biopsy cavity. Site 2: RIGHT breast calcifications, upper-outer quadrant Using sterile technique and 1% lidocaine  and 1% lidocaine  with epinephrine  as local anesthetic, under stereotactic guidance, a 9 gauge vacuum assisted device was used to perform core needle biopsy of RIGHT breast calcifications in the upper-outer quadrant using a superior approach. Specimen radiograph was performed showing representative calcifications. Specimens with calcifications are identified for pathology. Lesion quadrant: Upper-outer At the conclusion of the procedure, coil-shaped tissue marker clip was deployed into the biopsy cavity. Follow-up 2-view mammogram was performed and dictated separately. IMPRESSION: Stereotactic-guided biopsy of RIGHT breast calcifications in the upper-outer quadrant and lower inner quadrant. No apparent complications.  Electronically Signed: By: Norleen Croak M.D. On: 04/02/2024 14:00   MM CLIP PLACEMENT RIGHT Result Date: 04/02/2024 CLINICAL DATA:  Status post RIGHT breast stereotactic biopsy x2 EXAM: 3D DIAGNOSTIC RIGHT MAMMOGRAM POST STEREOTACTIC BIOPSY COMPARISON:  Previous exam(s). ACR Breast Density Category c: The breasts are heterogeneously dense, which may obscure small masses. FINDINGS: 3D Mammographic images were obtained following stereotactic guided biopsy of RIGHT breast calcifications in the upper-outer quadrant and lower inner quadrant. Appropriate positioning of the ribbon-shaped biopsy marking clip at the site of biopsy in the lower inner quadrant of the RIGHT breast. Appropriate positioning of the coil-shaped biopsy marking clip at the site of biopsy in the upper-outer quadrant of the RIGHT breast. IMPRESSION: Appropriate positioning of the biopsy marking clips at the sites of biopsy in the RIGHT breast. Final Assessment: Post Procedure Mammograms for Marker Placement Electronically Signed   By: Norleen Croak M.D.   On: 04/02/2024 14:14       Assessment & Plan:  Routine general medical examination at a health care facility  Screening for colon cancer  Hypercholesterolemia Assessment & Plan: Taking crestor  10mg  q day.  Low cholesterol diet and exercise.  Follow lipid panel.  Lab Results  Component Value Date   CHOL 152 04/05/2024   HDL 52.40 04/05/2024   LDLCALC 78 04/05/2024   LDLDIRECT 128.0 01/22/2019   TRIG 109.0 04/05/2024   CHOLHDL 3 04/05/2024    Orders: -     Hepatic function panel; Future -     Lipid panel; Future -     CBC with Differential/Platelet; Future  Essential hypertension Assessment & Plan: Continue lisinopril . Blood pressures as outlined. Follow pressures. Follow metabolic panel.   Orders: -     Basic metabolic panel with GFR; Future  Hyperglycemia Assessment & Plan: Low carb diet and exercise.  Follow met b and A1c.  Lab Results  Component Value Date    HGBA1C 6.0 04/05/2024    Orders: -     Hemoglobin A1c; Future  Health care maintenance Assessment & Plan: Physical today 04/11/24.  PAP 07/25/22 - negative with HPV negative.  Mammogram 03/2024 - recommended biopsy. Biopsy  ok. Colonoscopy 06/2013.  Follow up colonoscopy in 10 years. Due.    Vitamin D deficiency Assessment & Plan: Check vitamin D level with next labs.   Orders: -     VITAMIN D 25 Hydroxy (Vit-D Deficiency, Fractures); Future  Stress Assessment & Plan: Continues on citalopram . Overall appears to be doing well. Follow.    Abdominal aortic ectasia Assessment & Plan: Continue risk factor modification. Recommended f/u 5 years - last 2022.    Other orders -     Citalopram  Hydrobromide; Take 1 tablet (10 mg total) by mouth daily.  Dispense: 90 tablet; Refill: 1 -     Lisinopril ; Take 1 tablet (10 mg total) by mouth daily.  Dispense: 90 tablet; Refill: 1 -     Rosuvastatin  Calcium ; Take 1 tablet (10 mg total) by mouth daily.  Dispense: 90 tablet; Refill: 3     Allena Hamilton, MD "

## 2024-04-20 ENCOUNTER — Encounter: Payer: Self-pay | Admitting: Internal Medicine

## 2024-04-20 NOTE — Assessment & Plan Note (Signed)
 Continues on citalopram . Overall appears to be doing well. Follow.

## 2024-04-20 NOTE — Assessment & Plan Note (Signed)
 Physical today 04/11/24.  PAP 07/25/22 - negative with HPV negative.  Mammogram 03/2024 - recommended biopsy. Biopsy ok. Colonoscopy 06/2013.  Follow up colonoscopy in 10 years. Due.

## 2024-04-20 NOTE — Assessment & Plan Note (Signed)
 Continue risk factor modification. Recommended f/u 5 years - last 2022.

## 2024-04-20 NOTE — Assessment & Plan Note (Signed)
 Low carb diet and exercise.  Follow met b and A1c.  Lab Results  Component Value Date   HGBA1C 6.0 04/05/2024

## 2024-04-20 NOTE — Assessment & Plan Note (Signed)
Continue lisinopril.  Blood pressures as outlined.  Follow pressures.  Follow metabolic panel.  

## 2024-04-20 NOTE — Assessment & Plan Note (Signed)
 Taking crestor  10mg  q day.  Low cholesterol diet and exercise.  Follow lipid panel.  Lab Results  Component Value Date   CHOL 152 04/05/2024   HDL 52.40 04/05/2024   LDLCALC 78 04/05/2024   LDLDIRECT 128.0 01/22/2019   TRIG 109.0 04/05/2024   CHOLHDL 3 04/05/2024

## 2024-04-20 NOTE — Assessment & Plan Note (Signed)
 Check vitamin D level with next labs.  ?

## 2024-08-15 ENCOUNTER — Other Ambulatory Visit

## 2024-08-22 ENCOUNTER — Ambulatory Visit: Admitting: Internal Medicine
